# Patient Record
Sex: Female | Born: 1985 | Hispanic: No | Marital: Married | Smoking: Never smoker
Health system: Southern US, Community
[De-identification: ages and names within clinical notes are randomized; demographics above are authoritative.]

## PROBLEM LIST (undated history)

## (undated) DIAGNOSIS — Z789 Other specified health status: Secondary | ICD-10-CM

## (undated) DIAGNOSIS — D62 Acute posthemorrhagic anemia: Secondary | ICD-10-CM

## (undated) HISTORY — PX: NO PAST SURGERIES: SHX2092

---

## 2012-08-15 LAB — OB RESULTS CONSOLE ANTIBODY SCREEN
ANTIBODY SCREEN: NEGATIVE
ANTIBODY SCREEN: NEGATIVE

## 2012-08-15 LAB — OB RESULTS CONSOLE RPR
RPR: NONREACTIVE
RPR: NONREACTIVE

## 2012-08-15 LAB — OB RESULTS CONSOLE ABO/RH
RH TYPE: POSITIVE
RH Type: POSITIVE

## 2012-08-15 LAB — OB RESULTS CONSOLE RUBELLA ANTIBODY, IGM
Rubella: IMMUNE
Rubella: IMMUNE

## 2012-08-15 LAB — OB RESULTS CONSOLE GC/CHLAMYDIA
Chlamydia: NEGATIVE
GC PROBE AMP, GENITAL: NEGATIVE

## 2012-08-15 LAB — OB RESULTS CONSOLE HEPATITIS B SURFACE ANTIGEN
HEP B S AG: NEGATIVE
Hepatitis B Surface Ag: NEGATIVE

## 2012-08-15 LAB — OB RESULTS CONSOLE HIV ANTIBODY (ROUTINE TESTING)
HIV: NONREACTIVE
HIV: NONREACTIVE

## 2012-08-23 ENCOUNTER — Inpatient Hospital Stay (HOSPITAL_COMMUNITY): Admission: AD | Admit: 2012-08-23 | Payer: Self-pay | Source: Ambulatory Visit | Admitting: Obstetrics & Gynecology

## 2012-08-23 LAB — OB RESULTS CONSOLE GC/CHLAMYDIA
Chlamydia: NEGATIVE
Gonorrhea: NEGATIVE

## 2013-02-12 LAB — OB RESULTS CONSOLE GBS
GBS: NEGATIVE
GBS: NEGATIVE

## 2013-02-21 NOTE — L&D Delivery Note (Signed)
Operative Delivery Note At 2:08 PM a viable and healthy female was delivered via Vaginal, Vacuum Investment banker, operational(Extractor).  Presentation: vertex; Position: Occiput,, Anterior; Station: +2 w/ caput.  Verbal consent: obtained from patient.  Risks and benefits discussed in detail.  Risks include, but are not limited to the risks of anesthesia, bleeding, infection, damage to maternal tissues, fetal cephalhematoma.  There is also the risk of inability to effect vaginal delivery of the head, or shoulder dystocia that cannot be resolved by established maneuvers, leading to the need for emergency cesarean section. Tracing reviewed: late decels  And variables noted. Advised assistance with delivery. APGAR: 7, 9; weight  pending.   Placenta status: Intact, Pathology for maternal fever to 100.2 ( ax) Spontaneous.   Cord: 3 vessels with the following complications: None.  Cord pH: none  Anesthesia: Epidural  Instruments: mushroom vacuum Episiotomy: None Lacerations:  Left Vaginal;Labial  minora Suture Repair: 3.0 chromic Est. Blood Loss (mL): 300  Mom to postpartum.  Baby to Couplet care / Skin to Skin.  Teegan Brandis A 03/17/2013, 2:41 PM

## 2013-03-13 ENCOUNTER — Telehealth (HOSPITAL_COMMUNITY): Payer: Self-pay | Admitting: *Deleted

## 2013-03-13 ENCOUNTER — Encounter (HOSPITAL_COMMUNITY): Payer: Self-pay | Admitting: *Deleted

## 2013-03-13 NOTE — Telephone Encounter (Signed)
Preadmission screen  

## 2013-03-16 ENCOUNTER — Encounter (HOSPITAL_COMMUNITY): Payer: Self-pay | Admitting: *Deleted

## 2013-03-16 ENCOUNTER — Inpatient Hospital Stay (HOSPITAL_COMMUNITY)
Admission: AD | Admit: 2013-03-16 | Discharge: 2013-03-19 | DRG: 774 | Disposition: A | Discharge status: Home / Self Care | Payer: BC Managed Care – PPO | Source: Ambulatory Visit | Attending: Obstetrics and Gynecology | Admitting: Obstetrics and Gynecology

## 2013-03-16 HISTORY — DX: Other specified health status: Z78.9

## 2013-03-16 NOTE — Progress Notes (Signed)
No fld noted on perineum. Will have provider do spec exam to r/o rupture. Pt agrees

## 2013-03-16 NOTE — MAU Note (Signed)
My water broke about 2200. No contractions. Fld was clear with alittle blood in it.

## 2013-03-17 ENCOUNTER — Inpatient Hospital Stay (HOSPITAL_COMMUNITY): Payer: BC Managed Care – PPO | Admitting: Anesthesiology

## 2013-03-17 ENCOUNTER — Encounter (HOSPITAL_COMMUNITY): Payer: Self-pay

## 2013-03-17 ENCOUNTER — Encounter (HOSPITAL_COMMUNITY): Payer: BC Managed Care – PPO | Admitting: Anesthesiology

## 2013-03-17 LAB — RPR: RPR Ser Ql: NONREACTIVE

## 2013-03-17 LAB — AMNISURE RUPTURE OF MEMBRANE (ROM) NOT AT ARMC: Amnisure ROM: POSITIVE

## 2013-03-17 LAB — CBC
HEMATOCRIT: 33.6 % — AB (ref 36.0–46.0)
Hemoglobin: 11.8 g/dL — ABNORMAL LOW (ref 12.0–15.0)
MCH: 31.7 pg (ref 26.0–34.0)
MCHC: 35.1 g/dL (ref 30.0–36.0)
MCV: 90.3 fL (ref 78.0–100.0)
Platelets: 210 10*3/uL (ref 150–400)
RBC: 3.72 MIL/uL — AB (ref 3.87–5.11)
RDW: 12.7 % (ref 11.5–15.5)
WBC: 13.8 10*3/uL — AB (ref 4.0–10.5)

## 2013-03-17 LAB — POCT FERN TEST: POCT Fern Test: NEGATIVE

## 2013-03-17 MED ORDER — IBUPROFEN 600 MG PO TABS
600.0000 mg | ORAL_TABLET | Freq: Four times a day (QID) | ORAL | Status: DC
  Administered 2013-03-17 – 2013-03-19 (×7): 600 mg via ORAL
  Filled 2013-03-17 (×7): qty 1

## 2013-03-17 MED ORDER — PHENYLEPHRINE 40 MCG/ML (10ML) SYRINGE FOR IV PUSH (FOR BLOOD PRESSURE SUPPORT)
80.0000 ug | PREFILLED_SYRINGE | INTRAVENOUS | Status: DC | PRN
  Administered 2013-03-17: 80 ug via INTRAVENOUS
  Filled 2013-03-17: qty 2

## 2013-03-17 MED ORDER — ONDANSETRON HCL 4 MG/2ML IJ SOLN: 4.0000 mg | INTRAMUSCULAR | Status: DC | PRN

## 2013-03-17 MED ORDER — FLEET ENEMA 7-19 GM/118ML RE ENEM: 1.0000 | ENEMA | RECTAL | Status: DC | PRN

## 2013-03-17 MED ORDER — ONDANSETRON HCL 4 MG/2ML IJ SOLN: 4.0000 mg | Freq: Four times a day (QID) | INTRAMUSCULAR | Status: DC | PRN

## 2013-03-17 MED ORDER — SODIUM CHLORIDE 0.9 % IV SOLN
INTRAVENOUS | Status: DC
  Administered 2013-03-17: 10:00:00 via EPIDURAL
  Filled 2013-03-17 (×4): qty 12.5

## 2013-03-17 MED ORDER — DIBUCAINE 1 % RE OINT: 1.0000 "application " | TOPICAL_OINTMENT | RECTAL | Status: DC | PRN

## 2013-03-17 MED ORDER — BENZOCAINE-MENTHOL 20-0.5 % EX AERO
1.0000 "application " | INHALATION_SPRAY | CUTANEOUS | Status: DC | PRN
  Administered 2013-03-17: 1 via TOPICAL
  Filled 2013-03-17: qty 56

## 2013-03-17 MED ORDER — BUPIVACAINE HCL (PF) 0.25 % IJ SOLN
INTRAMUSCULAR | Status: DC | PRN
  Administered 2013-03-17: 10 mL via EPIDURAL

## 2013-03-17 MED ORDER — FERROUS SULFATE 325 (65 FE) MG PO TABS
325.0000 mg | ORAL_TABLET | Freq: Two times a day (BID) | ORAL | Status: DC
  Administered 2013-03-18 – 2013-03-19 (×3): 325 mg via ORAL
  Filled 2013-03-17 (×3): qty 1

## 2013-03-17 MED ORDER — SENNOSIDES-DOCUSATE SODIUM 8.6-50 MG PO TABS
2.0000 | ORAL_TABLET | ORAL | Status: DC
Start: 2013-03-18 — End: 2013-03-19
  Administered 2013-03-18 (×2): 2 via ORAL
  Filled 2013-03-17 (×2): qty 2

## 2013-03-17 MED ORDER — LIDOCAINE HCL (PF) 1 % IJ SOLN
30.0000 mL | INTRAMUSCULAR | Status: DC | PRN
  Administered 2013-03-17: 30 mL via SUBCUTANEOUS
  Filled 2013-03-17 (×2): qty 30

## 2013-03-17 MED ORDER — LACTATED RINGERS IV SOLN
500.0000 mL | INTRAVENOUS | Status: DC | PRN
Start: 2013-03-17 — End: 2013-03-17

## 2013-03-17 MED ORDER — WITCH HAZEL-GLYCERIN EX PADS: 1.0000 "application " | MEDICATED_PAD | CUTANEOUS | Status: DC | PRN

## 2013-03-17 MED ORDER — FENTANYL 2.5 MCG/ML BUPIVACAINE 1/10 % EPIDURAL INFUSION (WH - ANES)
14.0000 mL/h | INTRAMUSCULAR | Status: DC | PRN
  Administered 2013-03-17: 14 mL/h via EPIDURAL
  Filled 2013-03-17: qty 125

## 2013-03-17 MED ORDER — OXYCODONE-ACETAMINOPHEN 5-325 MG PO TABS: 1.0000 | ORAL_TABLET | ORAL | Status: DC | PRN

## 2013-03-17 MED ORDER — BUTORPHANOL TARTRATE 1 MG/ML IJ SOLN
1.0000 mg | INTRAMUSCULAR | Status: DC | PRN
  Administered 2013-03-17: 1 mg via INTRAVENOUS
  Filled 2013-03-17: qty 1

## 2013-03-17 MED ORDER — OXYTOCIN 40 UNITS IN LACTATED RINGERS INFUSION - SIMPLE MED: 62.5000 mL/h | INTRAVENOUS | Status: DC

## 2013-03-17 MED ORDER — DIPHENHYDRAMINE HCL 25 MG PO CAPS: 25.0000 mg | ORAL_CAPSULE | Freq: Four times a day (QID) | ORAL | Status: DC | PRN

## 2013-03-17 MED ORDER — ACETAMINOPHEN 325 MG PO TABS: 650.0000 mg | ORAL_TABLET | ORAL | Status: DC | PRN

## 2013-03-17 MED ORDER — EPHEDRINE 5 MG/ML INJ
10.0000 mg | INTRAVENOUS | Status: DC | PRN
  Administered 2013-03-17: 10 mg via INTRAVENOUS
  Filled 2013-03-17: qty 2

## 2013-03-17 MED ORDER — ONDANSETRON HCL 4 MG PO TABS: 4.0000 mg | ORAL_TABLET | ORAL | Status: DC | PRN

## 2013-03-17 MED ORDER — TERBUTALINE SULFATE 1 MG/ML IJ SOLN: 0.2500 mg | Freq: Once | INTRAMUSCULAR | Status: DC | PRN

## 2013-03-17 MED ORDER — PHENYLEPHRINE 40 MCG/ML (10ML) SYRINGE FOR IV PUSH (FOR BLOOD PRESSURE SUPPORT)
80.0000 ug | PREFILLED_SYRINGE | INTRAVENOUS | Status: DC | PRN
  Filled 2013-03-17: qty 2
  Filled 2013-03-17: qty 10

## 2013-03-17 MED ORDER — EPHEDRINE 5 MG/ML INJ
10.0000 mg | INTRAVENOUS | Status: DC | PRN
  Filled 2013-03-17: qty 4
  Filled 2013-03-17: qty 2

## 2013-03-17 MED ORDER — PRENATAL MULTIVITAMIN CH
1.0000 | ORAL_TABLET | Freq: Every day | ORAL | Status: DC
  Administered 2013-03-18: 1 via ORAL
  Filled 2013-03-17: qty 1

## 2013-03-17 MED ORDER — CITRIC ACID-SODIUM CITRATE 334-500 MG/5ML PO SOLN: 30.0000 mL | ORAL | Status: DC | PRN

## 2013-03-17 MED ORDER — LANOLIN HYDROUS EX OINT: TOPICAL_OINTMENT | CUTANEOUS | Status: DC | PRN

## 2013-03-17 MED ORDER — OXYTOCIN BOLUS FROM INFUSION: 500.0000 mL | INTRAVENOUS | Status: DC

## 2013-03-17 MED ORDER — DIPHENHYDRAMINE HCL 50 MG/ML IJ SOLN: 12.5000 mg | INTRAMUSCULAR | Status: DC | PRN

## 2013-03-17 MED ORDER — LACTATED RINGERS IV SOLN
INTRAVENOUS | Status: DC
  Administered 2013-03-17 (×3): via INTRAVENOUS

## 2013-03-17 MED ORDER — ZOLPIDEM TARTRATE 5 MG PO TABS: 5.0000 mg | ORAL_TABLET | Freq: Every evening | ORAL | Status: DC | PRN

## 2013-03-17 MED ORDER — SIMETHICONE 80 MG PO CHEW: 80.0000 mg | CHEWABLE_TABLET | ORAL | Status: DC | PRN

## 2013-03-17 MED ORDER — OXYTOCIN 40 UNITS IN LACTATED RINGERS INFUSION - SIMPLE MED
1.0000 m[IU]/min | INTRAVENOUS | Status: DC
  Administered 2013-03-17: 2 m[IU]/min via INTRAVENOUS
  Filled 2013-03-17: qty 1000

## 2013-03-17 MED ORDER — LACTATED RINGERS IV SOLN: 500.0000 mL | Freq: Once | INTRAVENOUS | Status: DC

## 2013-03-17 MED ORDER — IBUPROFEN 600 MG PO TABS: 600.0000 mg | ORAL_TABLET | Freq: Four times a day (QID) | ORAL | Status: DC | PRN

## 2013-03-17 MED ORDER — LIDOCAINE HCL (PF) 1 % IJ SOLN
INTRAMUSCULAR | Status: DC | PRN
  Administered 2013-03-17 (×2): 5 mL

## 2013-03-17 NOTE — Progress Notes (Signed)
Dierdre SearlesKristie Vent is a 28 y.o. G1P0 at 5741w6d by ultrasound admitted for rupture of membranes  Subjective: Chief Complaint  Patient presents with  . Rupture of Membranes   Breathing with ctx c/o rectal pressure. Unable to void. Bladder distended  Objective: BP 96/39  Pulse 50  Temp(Src) 97.8 F (36.6 C) (Oral)  Resp 18  Ht 5\' 3"  (1.6 m)  Wt 75.751 kg (167 lb)  BMI 29.59 kg/m2      FHT:  FHR: 145 bpm, variability: moderate,  accelerations:  Present,  decelerations:  Absent UC:   regular, every 2 minutes SVE:   3-4 cm dilated, 80% effaced slightly puffy, -1 station increased bloody show Tracing: cat 1  Labs: Lab Results  Component Value Date   WBC 13.8* 03/17/2013   HGB 11.8* 03/17/2013   HCT 33.6* 03/17/2013   MCV 90.3 03/17/2013   PLT 210 03/17/2013    Assessment / Plan: Spontaneous labor, progressing normally P) foley catheter. epidural  Anticipated MOD:  NSVD  Latashia Koch A 03/17/2013, 7:24 AM

## 2013-03-17 NOTE — Progress Notes (Signed)
0004 Up to BR

## 2013-03-17 NOTE — Lactation Note (Signed)
This note was copied from the chart of Dawn Dierdre SearlesKristie Golden. Lactation Consultation Note Initial visit at 6 hours of age.  Mom is feeding baby now in cradle position, baby with wide flanged lips and rhythmic suckling for several minutes.  Encouraged deeper latch with cross cradle hold and discussed what a deep latch looks like.  Mom denies pain and reports seeing colostrum with hand expression.  Mom wants to know how she knows if baby is getting enough.  Discussed feeding cues, cluster feeding and skin to skin.  Referred to baby and me booklet.  [facility] resources given and discussed.  Mom to call for assist as needed.    Patient Name: Dawn Parker Today's Date: 03/17/2013 Reason for consult: Initial assessment   Maternal Data    Feeding Feeding Type: Breast Fed Length of feed: 10 min  LATCH Score/Interventions Latch: Grasps breast easily, tongue down, lips flanged, rhythmical sucking. Intervention(s): Teach feeding cues  Audible Swallowing: A few with stimulation  Type of Nipple: Everted at rest and after stimulation  Comfort (Breast/Nipple): Soft / non-tender     Hold (Positioning): Assistance needed to correctly position infant at breast and maintain latch. Intervention(s): Position options;Support Pillows;Breastfeeding basics reviewed  LATCH Score: 8  Lactation Tools Discussed/Used     Consult Status Consult Status: Follow-up Date: 03/18/13 Follow-up type: In-patient    Beverely RisenShoptaw, Arvella MerlesJana Lynn 03/17/2013, 8:56 PM

## 2013-03-17 NOTE — Plan of Care (Signed)
Problem: Consults Goal: Birthing Suites Patient Information Press F2 to bring up selections list  Outcome: Completed/Met Date Met:  03/17/13  Pt 37-[redacted] weeks EGA

## 2013-03-17 NOTE — Consult Note (Signed)
Neonatology Note:   Attendance at Delivery:    I was asked by Dr. Cherly Hensenousins to attend this vacuum-assisted vaginal delivery at term. The mother is a G1P0 A pos, GBS neg with an uncomplicated pregnancy. ROM 14 hours prior to delivery, fluid clear. Mother had temperature of 100.2 degrees just before delivery. Infant with decreased tone and mottled color at birth, but with normal HR. Needed bulb suctioning for removal of clear secretions, then she cried well. Tone and perfusion improved so that, at 5 minutes, her tone was normal and her capillary refill was 1 second. Ap 7/9. Lungs clear to ausc in DR. 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 I showed this to her parents and counseled them to see a Dermatologist and consider removal of the nevus sometime in the first few years of life as this type of nevus, when present at birth, has about a 10% chance of malignant transformation over her lifetime.To CN to care of Pediatrician.   Doretha Souhristie C. Glennis Montenegro, MD

## 2013-03-17 NOTE — Anesthesia Procedure Notes (Signed)
Epidural Patient location during procedure: OB Start time: 03/17/2013 7:30 AM  Staffing Anesthesiologist: Brayton CavesJACKSON, Mushka Laconte Performed by: anesthesiologist   Preanesthetic Checklist Completed: patient identified, site marked, surgical consent, pre-op evaluation, timeout performed, IV checked, risks and benefits discussed and monitors and equipment checked  Epidural Patient position: sitting Prep: site prepped and draped and DuraPrep Patient monitoring: continuous pulse ox and blood pressure Approach: midline Injection technique: LOR air  Needle:  Needle type: Tuohy  Needle gauge: 17 G Needle length: 9 cm and 9 Needle insertion depth: 5 cm cm Catheter type: closed end flexible Catheter size: 19 Gauge Catheter at skin depth: 10 cm Test dose: negative  Assessment Events: blood not aspirated, injection not painful, no injection resistance, negative IV test and no paresthesia  Additional Notes Patient identified.  Risk benefits discussed including failed block, incomplete pain control, headache, nerve damage, paralysis, blood pressure changes, nausea, vomiting, reactions to medication both toxic or allergic, and postpartum back pain.  Patient expressed understanding and wished to proceed.  All questions were answered.  Sterile technique used throughout procedure and epidural site dressed with sterile barrier dressing. No paresthesia or other complications noted.The patient did not experience any signs of intravascular injection such as tinnitus or metallic taste in mouth nor signs of intrathecal spread such as rapid motor block. Please see nursing notes for vital signs.

## 2013-03-17 NOTE — Progress Notes (Signed)
S: comfortable Pitocin 8 MIU O: VE 7-8 cm/100/-2 asynclytic Tracing cat 1. Ctx q  IMP: active phase P) cont pitocin  Exaggerated left sims position

## 2013-03-17 NOTE — H&P (Signed)
Dawn SearlesKristie Parker is a 28 y.o. female presenting for early labor (+) SROM @ 10:30 pm confirmed by (+) amniosure  Maternal Medical History:  Reason for admission: Rupture of membranes and contractions.   Contractions: Onset was 6-12 hours ago.   Frequency: irregular.   Perceived severity is moderate.    Fetal activity: Perceived fetal activity is normal.    Prenatal complications: no prenatal complications Prenatal Complications - Diabetes: none.    OB History   Grav Para Term Preterm Abortions TAB SAB Ect Mult Living   1              Past Medical History  Diagnosis Date  . Medical history non-contributory    Past Surgical History  Procedure Laterality Date  . No past surgeries     Family History: family history includes Cancer in her paternal grandmother. Social History:  reports that she has never smoked. She does not have any smokeless tobacco history on file. She reports that she does not drink alcohol or use illicit drugs.   Prenatal Transfer Tool  Maternal Diabetes: No Genetic Screening: Normal Maternal Ultrasounds/Referrals: Normal Fetal Ultrasounds or other Referrals:  None Maternal Substance Abuse:  No Significant Maternal Medications:  None Significant Maternal Lab Results:  Lab values include: Group B Strep negative Other Comments:  None  Review of Systems  All other systems reviewed and are negative.    Dilation: 2 Effacement (%): 80 Station: -2 Exam by:: Dawn Clarkston MD Blood pressure 96/39, pulse 50, temperature 97.8 F (36.6 C), temperature source Oral, resp. rate 18, height 5\' 3"  (1.6 m), weight 75.751 kg (167 lb). Exam Physical Exam  Constitutional: She is oriented to person, place, and time. She appears well-developed and well-nourished.  HENT:  Head: Normocephalic.  Neck: Neck supple.  Cardiovascular: Normal rate.   Respiratory: Breath sounds normal.  GI: Soft.  Musculoskeletal: She exhibits no edema.  Neurological: She is alert and oriented to  person, place, and time.  Skin: Skin is warm and dry.  Psychiatric: She has a normal mood and affect.    Prenatal labs: ABO, Rh: A/Positive/-- (06/25 0000) Antibody: Negative (06/25 0000) Rubella: Immune (06/25 0000) RPR: Nonreactive (06/25 0000)  HBsAg: Negative (06/25 0000)  HIV: Non-reactive (06/25 0000)  GBS: Negative (12/23 0000)   Assessment/Plan:  Term gestation SROM P) admit routine labs. Pitocin augmentation  Epidural prn Dawn Parker A 03/17/2013, 7:03 AM

## 2013-03-17 NOTE — Progress Notes (Signed)
Pt had special epidural med mixture from pharmacy which could not be wasted in pixis. 64.5 ml wasted in sink after delivery and this was witnessed by Hazel SamsMary Early, RN

## 2013-03-17 NOTE — Anesthesia Preprocedure Evaluation (Signed)

## 2013-03-17 NOTE — Progress Notes (Signed)
Report called to Caroline RN in BS. Pt to BS via w/c.  

## 2013-03-17 NOTE — Progress Notes (Signed)
Dr Cherly Hensenousins notified of pos amnisure. Will admit to Bs

## 2013-03-17 NOTE — Progress Notes (Signed)
Dr Cherly Hensenousins notified of pt's admission and status. Aware of ? SROM with neg fern x 2. Will proceed with amnisure.

## 2013-03-18 ENCOUNTER — Encounter (HOSPITAL_COMMUNITY): Payer: Self-pay | Admitting: Obstetrics and Gynecology

## 2013-03-18 LAB — CBC
HEMATOCRIT: 31.1 % — AB (ref 36.0–46.0)
HEMOGLOBIN: 10.7 g/dL — AB (ref 12.0–15.0)
MCH: 31.5 pg (ref 26.0–34.0)
MCHC: 34.4 g/dL (ref 30.0–36.0)
MCV: 91.5 fL (ref 78.0–100.0)
Platelets: 172 10*3/uL (ref 150–400)
RBC: 3.4 MIL/uL — ABNORMAL LOW (ref 3.87–5.11)
RDW: 12.9 % (ref 11.5–15.5)
WBC: 16.6 10*3/uL — ABNORMAL HIGH (ref 4.0–10.5)

## 2013-03-18 NOTE — Anesthesia Postprocedure Evaluation (Signed)
Anesthesia Post Note  Patient: Dierdre SearlesKristie Fratto  Procedure(s) Performed: * No procedures listed *  Anesthesia type: Epidural  Patient location: Mother/Baby  Post pain: Pain level controlled  Post assessment: Post-op Vital signs reviewed  Last Vitals:  Filed Vitals:   03/18/13 0521  BP: 105/67  Pulse: 61  Temp: 36.7 C  Resp: 18    Post vital signs: Reviewed  Level of consciousness: awake  Complications: No apparent anesthesia complications

## 2013-03-18 NOTE — Lactation Note (Signed)
This note was copied from the chart of Dawn Dierdre SearlesKristie Roh. Lactation Consultation Note Follow up consultation; mom states baby is breastfeeding very well; c/o slight sore nipples. Nipples with positional stripe bruise. Comfort gels provided, discussed position and latch. Baby began to show feeding cues, offered to assist with feeding, mom accepts.  Baby latched well to right side with deep latch, rhythmic sucking and occasional audible swallowing; mom states comfortable.  Enc mom to call for help if needed.   Patient Name: Dawn Parker: 03/18/2013 Reason for consult: Follow-up assessment   Maternal Data    Feeding Feeding Type: Breast Fed  LATCH Score/Interventions Latch: Grasps breast easily, tongue down, lips flanged, rhythmical sucking.  Audible Swallowing: Spontaneous and intermittent  Type of Nipple: Everted at rest and after stimulation  Comfort (Breast/Nipple): Filling, red/small blisters or bruises, mild/mod discomfort  Problem noted: Mild/Moderate discomfort Interventions (Mild/moderate discomfort): Comfort gels  Hold (Positioning): Assistance needed to correctly position infant at breast and maintain latch.  LATCH Score: 8  Lactation Tools Discussed/Used     Consult Status Consult Status: PRN    Lenard ForthSanders, Arty Lantzy Fulmer 03/18/2013, 3:30 PM

## 2013-03-18 NOTE — Progress Notes (Signed)
Ur chart review completed.  

## 2013-03-18 NOTE — Progress Notes (Signed)
Patient ID: Dierdre SearlesKristie Bisesi, female   DOB: [birth year], 28 y.o.   MRN: [id number] PPD # 1 SVD  S:  Reports feeling well             Tolerating po/ No nausea or vomiting             Bleeding is light             Pain controlled with ibuprofen (OTC)             Up ad lib / ambulatory / voiding without difficulties    Newborn  Information for the patient's newborn:  [name], Girl Sanyla [[id number]][[id number]]  female  breast feeding   O:  A & O x 3, in no apparent distress              VS:  Filed Vitals:   03/17/13 1705 03/17/13 1831 03/17/13 2205 03/18/13 0521  BP: 107/65 127/73 106/64 105/67  Pulse: 73 73 59 61  Temp: 99 F (37.2 C) 99.6 F (37.6 C) 98.9 F (37.2 C) 98.1 F (36.7 C)  TempSrc: Oral Oral Oral Oral  Resp: 18 18 18 18   Height:      Weight:      SpO2:        LABS:  Recent Labs  03/17/13 0211 03/18/13 0600  WBC 13.8* 16.6*  HGB 11.8* 10.7*  HCT 33.6* 31.1*  PLT 210 172    Blood type: A/Positive/-- (06/25 0000)  Rubella: Immune (06/25 0000)   I&O: I/O last 3 completed shifts: In: -  Out: 1600 [Urine:1300; Blood:300]             Lungs: Clear and unlabored  Heart: regular rate and rhythm / no murmurs  Abdomen: soft, non-tender, non-distended              Fundus: firm, non-tender, U-1  Perineum: labial repair healing well - no edema  Lochia: minimal  Extremities: trace edema, no calf pain or tenderness, no Homans    A/P: PPD # 1  27 y.o., G1P1001   Principal Problem:   Postpartum care following vaginal delivery (1/25)   Doing well - stable status  Routine post partum orders  Anticipate discharge tomorrow    Raelyn MoraAWSON, Pattricia Weiher, M, MSN, CNM 03/18/2013, 9:00 AM

## 2013-03-19 ENCOUNTER — Encounter (HOSPITAL_COMMUNITY): Payer: Self-pay | Admitting: *Deleted

## 2013-03-19 MED ORDER — IBUPROFEN 600 MG PO TABS: 600.0000 mg | ORAL_TABLET | Freq: Four times a day (QID) | ORAL | Status: DC | PRN

## 2013-03-19 MED ORDER — SENNOSIDES-DOCUSATE SODIUM 8.6-50 MG PO TABS: 2.0000 | ORAL_TABLET | Freq: Every evening | ORAL | Status: DC | PRN

## 2013-03-19 MED ORDER — FERROUS SULFATE 325 (65 FE) MG PO TABS: 325.0000 mg | ORAL_TABLET | Freq: Every day | ORAL | Status: DC

## 2013-03-19 MED ORDER — OXYCODONE-ACETAMINOPHEN 5-325 MG PO TABS: 1.0000 | ORAL_TABLET | Freq: Four times a day (QID) | ORAL | Status: DC | PRN

## 2013-03-19 NOTE — Discharge Summary (Signed)
Obstetric Discharge Summary Reason for Admission: G1 P0 with SROM and onset active labor @ 39wks Prenatal Procedures: NST and ultrasound Intrapartum Procedures: VAVD Postpartum Procedures: none Complications-Operative and Postpartum: minor left vaginal/labial tear with repair Hemoglobin  Date Value Range Status  03/18/2013 10.7* 12.0 - 15.0 g/dL Final     HCT  Date Value Range Status  03/18/2013 31.1* 36.0 - 46.0 % Final    Physical Exam:  General: alert, cooperative and no distress Lochia: appropriate Uterine Fundus: firm Incision: n/a DVT Evaluation: No evidence of DVT seen on physical exam. Negative Homan's sign.  Discharge Diagnoses: G1 P1 s/p VAVD @ 39wks, w/minor left vag/labial tear  Discharge Information: Date: 03/19/2013 Activity: pelvic rest Diet: routine Medications: PNV, Ibuprofen, Colace, Iron and Percocet Condition: stable Instructions: refer to practice specific booklet Discharge to: home Follow-up Information   Follow up with LAVOIE,MARIE-LYNE, MD In 6 weeks.   Specialty:  Obstetrics and Gynecology   Contact information:   [address]elda Severe[address] [place] [id number] [phone]       Newborn Data: Live born female on 03/17/13 Birth Weight: 8 lb 0.8 oz (3651 g) APGAR: 7, 9  Home with mother.  FISHER,JULIE K 03/19/2013, 10:01 AM

## 2013-03-19 NOTE — Progress Notes (Signed)
Patient ID: Dawn SearlesKristie Parker, female   DOB: 1985/10/27, 28 y.o.   MRN: [id number] PPD # 2  Subjective: Pt reports feeling well and eager for d/c home/ Pain controlled with ibuprofen and rare percocet Tolerating po/ Voiding without problems/ No n/v Bleeding is light/ Newborn info:  Information for the patient's newborn:  Dawn Parker, Dawn Parker [[id number]][[id number]]  female Feeding: breast    Objective:  VS: Blood pressure 99/60, pulse 62, temperature 97.9 F (36.6 C), temperature source Oral, resp. rate 18.    Recent Labs  03/17/13 0211 03/18/13 0600  WBC 13.8* 16.6*  HGB 11.8* 10.7*  HCT 33.6* 31.1*  PLT 210 172    Blood type: A/Positive Rubella: Immune    Physical Exam:  General:  alert, cooperative and no distress CV: Regular rate and rhythm Resp: clear Abdomen: soft, nontender, normal bowel sounds Uterine Fundus: firm, below umbilicus, nontender Perineum: healing with good reapproximation Lochia: minimal Ext: Homans sign is negative, no sign of DVT and no edema, redness or tenderness in the calves or thighs    A/P: PPD # 2/ G1P1001/ S/P: VAVD w/ left vaginal/labial tear w/repair Doing well and stable for discharge home RX: Ibuprofen 600mg  po Q 6 hrs prn pain #30 Refill x 1 Niferex 150mg  po QD/BID #30/#60 Refill x 1 Percocet 5/325 1 to 2 po Q 4 hrs prn pain #10 No refill Colace 100mg  po up to TID prn #30 Ref x 1 WOB/GYN booklet given Routine pp visit in 6wks   Demetrius RevelFISHER,Litsy Epting K, MSN, [facility] 03/19/2013, 9:10 AM

## 2013-03-19 NOTE — Lactation Note (Signed)
This note was copied from the chart of [name]. Lactation Consultation Note Follow up consult:  Baby 1943 hours old and being discharged.  Baby has breastfed 9 times in 24 hours, 4 voids, 5 stools.  Mother states breastfeeding going well (LS9), no complaints, some soreness.  Recommend if soreness gets worse to call lacatation department.  Mother currently using comfort gels.  Reviewed engorgement care and cluster feeding.  Encouraged mother to call if further assistance with latching if needed before discharge.   Patient Name: [name] Dawn Parker Date: 03/19/2013 Reason for consult: Follow-up assessment   Maternal Data    Feeding    LATCH Score/Interventions                      Lactation Tools Discussed/Used     Consult Status Consult Status: Complete    Hardie PulleyBerkelhammer, Tyger Wichman Boschen 03/19/2013, 9:58 AM

## 2013-03-20 ENCOUNTER — Inpatient Hospital Stay (HOSPITAL_COMMUNITY): Admission: RE | Admit: 2013-03-20 | Payer: Self-pay | Source: Ambulatory Visit

## 2013-10-05 ENCOUNTER — Ambulatory Visit (INDEPENDENT_AMBULATORY_CARE_PROVIDER_SITE_OTHER): Payer: BLUE CROSS/BLUE SHIELD

## 2013-10-05 ENCOUNTER — Ambulatory Visit (INDEPENDENT_AMBULATORY_CARE_PROVIDER_SITE_OTHER): Payer: BLUE CROSS/BLUE SHIELD | Admitting: Family Medicine

## 2013-10-05 VITALS — BP 112/72 | HR 76 | Temp 97.9°F | Resp 16 | Ht 63.0 in | Wt 131.8 lb

## 2013-10-05 DIAGNOSIS — M79609 Pain in unspecified limb: Secondary | ICD-10-CM

## 2013-10-05 DIAGNOSIS — S92402A Displaced unspecified fracture of left great toe, initial encounter for closed fracture: Secondary | ICD-10-CM

## 2013-10-05 DIAGNOSIS — M79675 Pain in left toe(s): Secondary | ICD-10-CM

## 2013-10-05 DIAGNOSIS — S92919A Unspecified fracture of unspecified toe(s), initial encounter for closed fracture: Secondary | ICD-10-CM

## 2013-10-05 NOTE — Patient Instructions (Signed)
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
## 2014-02-21 NOTE — L&D Delivery Note (Signed)
Operative Delivery Note At 1:17 AM a viable and healthy female was delivered via Vaginal, Vacuum Investment banker, operational).  Presentation: vertex; Position: Right,, Occiput,, Anterior; Station: +2.  Verbal consent: obtained from patient.  Risks and benefits discussed in detail.  Risks include, but are not limited to the risks of anesthesia, bleeding, infection, damage to maternal tissues, fetal cephalhematoma.  There is also the risk of inability to effect vaginal delivery of the head, or shoulder dystocia that cannot be resolved by established maneuvers, leading to the need for emergency cesarean section. For terminal bradycardia APGAR: 9, 10; weight pending .   Placenta status: Intact, Spontaneous.  Marginal cord insertion Cord: 3 vessels with the following complications: None.  Cord pH: none  Anesthesia: Epidural  Instruments: mushroom Episiotomy: None Lacerations: 2nd degree;Perineal Suture Repair: 3.0 chromic Est. Blood Loss (mL): 350  Mom to postpartum.  Baby to Couplet care / Skin to Skin.  Dawn Parker A 10/17/2014, 2:27 AM

## 2014-03-11 LAB — OB RESULTS CONSOLE RUBELLA ANTIBODY, IGM: RUBELLA: IMMUNE

## 2014-03-11 LAB — OB RESULTS CONSOLE RPR: RPR: NONREACTIVE

## 2014-03-11 LAB — OB RESULTS CONSOLE HEPATITIS B SURFACE ANTIGEN: Hepatitis B Surface Ag: NEGATIVE

## 2014-03-11 LAB — OB RESULTS CONSOLE HIV ANTIBODY (ROUTINE TESTING): HIV: NONREACTIVE

## 2014-03-20 LAB — OB RESULTS CONSOLE GC/CHLAMYDIA
Chlamydia: NEGATIVE
Gonorrhea: NEGATIVE

## 2014-09-08 LAB — OB RESULTS CONSOLE GBS: GBS: POSITIVE

## 2014-10-16 ENCOUNTER — Other Ambulatory Visit: Payer: Self-pay | Admitting: Obstetrics & Gynecology

## 2014-10-16 ENCOUNTER — Inpatient Hospital Stay (HOSPITAL_COMMUNITY)
Admission: AD | Admit: 2014-10-16 | Discharge: 2014-10-18 | DRG: 775 | Disposition: A | Discharge status: Home / Self Care | Payer: BLUE CROSS/BLUE SHIELD | Source: Ambulatory Visit | Attending: Obstetrics and Gynecology | Admitting: Obstetrics and Gynecology

## 2014-10-16 ENCOUNTER — Inpatient Hospital Stay (HOSPITAL_COMMUNITY): Payer: BLUE CROSS/BLUE SHIELD | Admitting: Anesthesiology

## 2014-10-16 ENCOUNTER — Encounter (HOSPITAL_COMMUNITY): Payer: Self-pay | Admitting: *Deleted

## 2014-10-16 DIAGNOSIS — O99824 Streptococcus B carrier state complicating childbirth: Secondary | ICD-10-CM | POA: Diagnosis present

## 2014-10-16 DIAGNOSIS — O2243 Hemorrhoids in pregnancy, third trimester: Secondary | ICD-10-CM | POA: Diagnosis present

## 2014-10-16 DIAGNOSIS — D62 Acute posthemorrhagic anemia: Secondary | ICD-10-CM | POA: Diagnosis not present

## 2014-10-16 DIAGNOSIS — O4103X0 Oligohydramnios, third trimester, not applicable or unspecified: Secondary | ICD-10-CM | POA: Diagnosis present

## 2014-10-16 DIAGNOSIS — Z8249 Family history of ischemic heart disease and other diseases of the circulatory system: Secondary | ICD-10-CM

## 2014-10-16 DIAGNOSIS — O4100X0 Oligohydramnios, unspecified trimester, not applicable or unspecified: Secondary | ICD-10-CM | POA: Diagnosis present

## 2014-10-16 DIAGNOSIS — Z8759 Personal history of other complications of pregnancy, childbirth and the puerperium: Secondary | ICD-10-CM

## 2014-10-16 DIAGNOSIS — Z3A40 40 weeks gestation of pregnancy: Secondary | ICD-10-CM | POA: Diagnosis present

## 2014-10-16 HISTORY — DX: Acute posthemorrhagic anemia: D62

## 2014-10-16 LAB — CBC
HEMATOCRIT: 35.1 % — AB (ref 36.0–46.0)
Hemoglobin: 11.9 g/dL — ABNORMAL LOW (ref 12.0–15.0)
MCH: 31.6 pg (ref 26.0–34.0)
MCHC: 33.9 g/dL (ref 30.0–36.0)
MCV: 93.1 fL (ref 78.0–100.0)
PLATELETS: 250 10*3/uL (ref 150–400)
RBC: 3.77 MIL/uL — AB (ref 3.87–5.11)
RDW: 13 % (ref 11.5–15.5)
WBC: 15.1 10*3/uL — AB (ref 4.0–10.5)

## 2014-10-16 LAB — ABO/RH: ABO/RH(D): A POS

## 2014-10-16 LAB — TYPE AND SCREEN
ABO/RH(D): A POS
Antibody Screen: NEGATIVE

## 2014-10-16 MED ORDER — CITRIC ACID-SODIUM CITRATE 334-500 MG/5ML PO SOLN: 30.0000 mL | ORAL | Status: DC | PRN

## 2014-10-16 MED ORDER — LIDOCAINE HCL (PF) 1 % IJ SOLN: 30.0000 mL | INTRAMUSCULAR | Status: DC | PRN

## 2014-10-16 MED ORDER — OXYCODONE-ACETAMINOPHEN 5-325 MG PO TABS
1.0000 | ORAL_TABLET | ORAL | Status: DC | PRN
  Administered 2014-10-17 (×2): 1 via ORAL
  Filled 2014-10-16 (×2): qty 1

## 2014-10-16 MED ORDER — LACTATED RINGERS IV SOLN: 500.0000 mL | INTRAVENOUS | Status: DC | PRN

## 2014-10-16 MED ORDER — ONDANSETRON HCL 4 MG/2ML IJ SOLN: 4.0000 mg | Freq: Four times a day (QID) | INTRAMUSCULAR | Status: DC | PRN

## 2014-10-16 MED ORDER — LACTATED RINGERS IV SOLN
INTRAVENOUS | Status: DC
  Administered 2014-10-16: via INTRAVENOUS

## 2014-10-16 MED ORDER — OXYTOCIN 40 UNITS IN LACTATED RINGERS INFUSION - SIMPLE MED: 62.5000 mL/h | INTRAVENOUS | Status: DC

## 2014-10-16 MED ORDER — PHENYLEPHRINE 40 MCG/ML (10ML) SYRINGE FOR IV PUSH (FOR BLOOD PRESSURE SUPPORT)
80.0000 ug | PREFILLED_SYRINGE | INTRAVENOUS | Status: DC | PRN
  Filled 2014-10-16: qty 20

## 2014-10-16 MED ORDER — OXYCODONE-ACETAMINOPHEN 5-325 MG PO TABS: 2.0000 | ORAL_TABLET | ORAL | Status: DC | PRN

## 2014-10-16 MED ORDER — FENTANYL 2.5 MCG/ML BUPIVACAINE 1/10 % EPIDURAL INFUSION (WH - ANES)
14.0000 mL/h | INTRAMUSCULAR | Status: DC | PRN
  Administered 2014-10-17: 14 mL/h via EPIDURAL
  Filled 2014-10-16: qty 125

## 2014-10-16 MED ORDER — OXYTOCIN 10 UNIT/ML IJ SOLN: 10.0000 [IU] | Freq: Once | INTRAMUSCULAR | Status: DC

## 2014-10-16 MED ORDER — ACETAMINOPHEN 325 MG PO TABS: 650.0000 mg | ORAL_TABLET | ORAL | Status: DC | PRN

## 2014-10-16 MED ORDER — DIPHENHYDRAMINE HCL 50 MG/ML IJ SOLN: 12.5000 mg | INTRAMUSCULAR | Status: DC | PRN

## 2014-10-16 MED ORDER — LACTATED RINGERS IV SOLN: INTRAVENOUS | Status: DC

## 2014-10-16 MED ORDER — PENICILLIN G POTASSIUM 5000000 UNITS IJ SOLR
5.0000 10*6.[IU] | Freq: Once | INTRAVENOUS | Status: AC
  Administered 2014-10-16: 5 10*6.[IU] via INTRAVENOUS
  Filled 2014-10-16: qty 5

## 2014-10-16 MED ORDER — PENICILLIN G POTASSIUM 5000000 UNITS IJ SOLR
2.5000 10*6.[IU] | INTRAVENOUS | Status: DC
  Administered 2014-10-16: 2.5 10*6.[IU] via INTRAVENOUS
  Filled 2014-10-16 (×5): qty 2.5

## 2014-10-16 MED ORDER — LIDOCAINE HCL (PF) 1 % IJ SOLN
INTRAMUSCULAR | Status: DC | PRN
  Administered 2014-10-16: 3 mL via EPIDURAL

## 2014-10-16 MED ORDER — OXYCODONE-ACETAMINOPHEN 5-325 MG PO TABS: 1.0000 | ORAL_TABLET | ORAL | Status: DC | PRN

## 2014-10-16 MED ORDER — FENTANYL 2.5 MCG/ML BUPIVACAINE 1/10 % EPIDURAL INFUSION (WH - ANES): 14.0000 mL/h | INTRAMUSCULAR | Status: DC | PRN

## 2014-10-16 MED ORDER — FLEET ENEMA 7-19 GM/118ML RE ENEM: 1.0000 | ENEMA | RECTAL | Status: DC | PRN

## 2014-10-16 MED ORDER — LIDOCAINE HCL (PF) 1 % IJ SOLN
30.0000 mL | INTRAMUSCULAR | Status: DC | PRN
  Filled 2014-10-16: qty 30

## 2014-10-16 MED ORDER — OXYTOCIN BOLUS FROM INFUSION: 500.0000 mL | INTRAVENOUS | Status: DC

## 2014-10-16 MED ORDER — EPHEDRINE 5 MG/ML INJ: 10.0000 mg | INTRAVENOUS | Status: DC | PRN

## 2014-10-16 MED ORDER — OXYTOCIN 40 UNITS IN LACTATED RINGERS INFUSION - SIMPLE MED
1.0000 m[IU]/min | INTRAVENOUS | Status: DC
  Administered 2014-10-16: 2 m[IU]/min via INTRAVENOUS
  Filled 2014-10-16: qty 1000

## 2014-10-16 MED ORDER — SODIUM BICARBONATE 8.4 % IV SOLN
INTRAVENOUS | Status: DC | PRN
  Administered 2014-10-16: 2 mL via EPIDURAL
  Administered 2014-10-16: 5 mL via EPIDURAL
  Administered 2014-10-16: 3 mL via EPIDURAL

## 2014-10-16 MED ORDER — TERBUTALINE SULFATE 1 MG/ML IJ SOLN
0.2500 mg | Freq: Once | INTRAMUSCULAR | Status: DC | PRN
Start: 2014-10-16 — End: 2014-10-17

## 2014-10-16 NOTE — MAU Note (Signed)
Patient sent for direct admit no beds on L&D, oliogo, some contractions.

## 2014-10-16 NOTE — Anesthesia Preprocedure Evaluation (Signed)

## 2014-10-16 NOTE — H&P (Signed)
Dawn Parker is a 29 y.o. female presenting @ 40 1/7 weeks for IOL 2nd to oligohydramnios.  Maternal Medical History:  Reason for admission: Contractions.   Fetal activity: Perceived fetal activity is normal.    Prenatal complications: Oligohydramnios.   Prenatal Complications - Diabetes: none.    OB History    Gravida Para Term Preterm AB TAB SAB Ectopic Multiple Living   Past Medical History  Diagnosis Date  . Medical history non-contributory   . Postpartum care following vaginal delivery (1/25) 03/17/2013   Past Surgical History  Procedure Laterality Date  . No past surgeries     Family History: family history includes Cancer in her paternal grandmother; Heart disease in her maternal grandmother; Hyperlipidemia in her father and mother. Social History:  reports that she has never smoked. She does not have any smokeless tobacco history on file. She reports that she does not drink alcohol or use illicit drugs.   Prenatal Transfer Tool  Maternal Diabetes: No Genetic Screening: Normal Maternal Ultrasounds/Referrals: Normal Fetal Ultrasounds or other Referrals:  None Maternal Substance Abuse:  No Significant Maternal Medications:  None Significant Maternal Lab Results:  Lab values include: Group B Strep positive Other Comments:  None  ROSneg    Blood pressure 119/67, pulse 78, temperature 98.8 F (37.1 C), temperature source Oral, resp. rate 18, height  (1.6 m), weight 77.565 kg (171 lb), currently breastfeeding. Exam Physical Exam  Constitutional: She is oriented to person, place, and time. She appears well-developed and well-nourished.  HENT:  Head: Normocephalic and atraumatic.  Eyes: EOM are normal.  Neck: Neck supple.  Cardiovascular: Normal rate and regular rhythm.   Respiratory: Breath sounds normal.  GI: Soft.  Neurological: She is alert and oriented to person, place, and time.  Skin: Skin is warm and dry.  Psychiatric: She has a  normal mood and affect.   VE 3.5/60/0 per RN Prenatal labs: ABO, Rh:  A positive Antibody:  neg Rubella:  Immune RPR:   NR HBsAg:  neg  HIV:   NR GBS:   positive  Assessment/Plan: Oligohydramnios Term gestation GBS cx (+) P) admit routine labs. IV pitocin. IV PCN epidural prn. Defer amniotomy   Dawn Parker A 10/16/2014, 6:28 PM

## 2014-10-16 NOTE — Anesthesia Procedure Notes (Signed)
Epidural Patient location during procedure: OB  Preanesthetic Checklist Completed: patient identified, site marked, surgical consent, pre-op evaluation, timeout performed, IV checked, risks and benefits discussed and monitors and equipment checked  Epidural Patient position: sitting Prep: site prepped and draped and DuraPrep Patient monitoring: continuous pulse ox and blood pressure Approach: midline Location: L3-L4 Injection technique: LOR air  Needle:  Needle type: Tuohy  Needle gauge: 17 G Needle length: 9 cm and 9 Needle insertion depth: 5 cm cm Catheter type: closed end flexible Catheter size: 19 Gauge Catheter at skin depth: 10 cm Test dose: negative  Assessment Events: blood not aspirated, injection not painful, no injection resistance, negative IV test and no paresthesia

## 2014-10-17 ENCOUNTER — Encounter (HOSPITAL_COMMUNITY): Payer: Self-pay | Admitting: *Deleted

## 2014-10-17 DIAGNOSIS — Z8759 Personal history of other complications of pregnancy, childbirth and the puerperium: Secondary | ICD-10-CM

## 2014-10-17 LAB — RPR: RPR Ser Ql: NONREACTIVE

## 2014-10-17 MED ORDER — LANOLIN HYDROUS EX OINT: TOPICAL_OINTMENT | CUTANEOUS | Status: DC | PRN

## 2014-10-17 MED ORDER — IBUPROFEN 800 MG PO TABS: 800.0000 mg | ORAL_TABLET | Freq: Three times a day (TID) | ORAL | Status: DC

## 2014-10-17 MED ORDER — ACETAMINOPHEN 325 MG PO TABS: 650.0000 mg | ORAL_TABLET | ORAL | Status: DC | PRN

## 2014-10-17 MED ORDER — ONDANSETRON HCL 4 MG PO TABS: 4.0000 mg | ORAL_TABLET | ORAL | Status: DC | PRN

## 2014-10-17 MED ORDER — HYDROCORTISONE 2.5 % RE CREA
TOPICAL_CREAM | Freq: Four times a day (QID) | RECTAL | Status: DC
  Filled 2014-10-17: qty 28.35

## 2014-10-17 MED ORDER — WITCH HAZEL-GLYCERIN EX PADS: 1.0000 "application " | MEDICATED_PAD | CUTANEOUS | Status: DC | PRN

## 2014-10-17 MED ORDER — PRENATAL MULTIVITAMIN CH
1.0000 | ORAL_TABLET | Freq: Every day | ORAL | Status: DC
  Administered 2014-10-17 – 2014-10-18 (×2): 1 via ORAL
  Filled 2014-10-17 (×2): qty 1

## 2014-10-17 MED ORDER — ZOLPIDEM TARTRATE 5 MG PO TABS: 5.0000 mg | ORAL_TABLET | Freq: Every evening | ORAL | Status: DC | PRN

## 2014-10-17 MED ORDER — FERROUS SULFATE 325 (65 FE) MG PO TABS
325.0000 mg | ORAL_TABLET | Freq: Two times a day (BID) | ORAL | Status: DC
  Administered 2014-10-18: 325 mg via ORAL
  Filled 2014-10-17 (×3): qty 1

## 2014-10-17 MED ORDER — IBUPROFEN 600 MG PO TABS
600.0000 mg | ORAL_TABLET | Freq: Four times a day (QID) | ORAL | Status: DC | PRN
  Administered 2014-10-17: 600 mg via ORAL
  Filled 2014-10-17: qty 1

## 2014-10-17 MED ORDER — ONDANSETRON HCL 4 MG/2ML IJ SOLN: 4.0000 mg | INTRAMUSCULAR | Status: DC | PRN

## 2014-10-17 MED ORDER — SIMETHICONE 80 MG PO CHEW: 80.0000 mg | CHEWABLE_TABLET | ORAL | Status: DC | PRN

## 2014-10-17 MED ORDER — SENNOSIDES-DOCUSATE SODIUM 8.6-50 MG PO TABS
2.0000 | ORAL_TABLET | ORAL | Status: DC
Start: 2014-10-18 — End: 2014-10-18
  Administered 2014-10-17: 2 via ORAL
  Filled 2014-10-17: qty 2

## 2014-10-17 MED ORDER — DIPHENHYDRAMINE HCL 25 MG PO CAPS: 25.0000 mg | ORAL_CAPSULE | Freq: Four times a day (QID) | ORAL | Status: DC | PRN

## 2014-10-17 MED ORDER — BENZOCAINE-MENTHOL 20-0.5 % EX AERO
1.0000 "application " | INHALATION_SPRAY | CUTANEOUS | Status: DC | PRN
  Filled 2014-10-17 (×2): qty 56

## 2014-10-17 MED ORDER — IBUPROFEN 600 MG PO TABS
600.0000 mg | ORAL_TABLET | Freq: Four times a day (QID) | ORAL | Status: DC
  Administered 2014-10-17 – 2014-10-18 (×5): 600 mg via ORAL
  Filled 2014-10-17 (×5): qty 1

## 2014-10-17 MED ORDER — OXYCODONE-ACETAMINOPHEN 5-325 MG PO TABS: 1.0000 | ORAL_TABLET | ORAL | Status: DC | PRN

## 2014-10-17 MED ORDER — DIBUCAINE 1 % RE OINT
1.0000 "application " | TOPICAL_OINTMENT | RECTAL | Status: DC | PRN
  Filled 2014-10-17: qty 28

## 2014-10-17 MED ORDER — OXYCODONE-ACETAMINOPHEN 5-325 MG PO TABS: 2.0000 | ORAL_TABLET | ORAL | Status: DC | PRN

## 2014-10-17 NOTE — Progress Notes (Signed)
Pt seen for vulvar swelling thought related to hematoma  VE: site inspected 1) edematous hemorrhoids nonthrombosed Perineal laceration still well approximated.  Lower labia majus ( Left > right ecchymotic with associated labial edema IMP: predominantly labial edema No indication for surgical intervention' Cont ice and elevate bottom

## 2014-10-17 NOTE — Lactation Note (Signed)
This note was copied from the chart of Dawn Parker. Lactation Consultation Note Initial visit at 20 hours of age.  Mom reports baby just finished a 15 minute feeding and denies pain with latch.  Mom has 11 month experience with older child breastfeeding.  [facility] LC resources given and discussed.  Encouraged to feed with early cues on demand.  Early newborn behavior discussed.  Hand expression reported by mom with colostrum visible.  Mom to call for assist as needed.    Patient Name: Dawn Parker [id number] Date: 10/17/2014 Reason for consult: Initial assessment   Maternal Data Has patient been taught Hand Expression?: Yes Does the patient have breastfeeding experience prior to this delivery?: Yes  Feeding Feeding Type: Breast Fed Length of feed: 15 min  LATCH Score/Interventions Latch: Grasps breast easily, tongue down, lips flanged, rhythmical sucking. Intervention(s): Skin to skin;Teach feeding cues  Audible Swallowing: A few with stimulation Intervention(s): Skin to skin;Hand expression Intervention(s): Alternate breast massage  Type of Nipple: Everted at rest and after stimulation  Comfort (Breast/Nipple): Soft / non-tender     Hold (Positioning): No assistance needed to correctly position infant at breast. Intervention(s): Breastfeeding basics reviewed  LATCH Score: 9  Lactation Tools Discussed/Used     Consult Status Consult Status: Follow-up Date: 10/18/14 Follow-up type: In-patient    Beverely Risen Arvella Merles 10/17/2014, 9:33 PM

## 2014-10-17 NOTE — Progress Notes (Signed)
TC from nurse to evaluate possible hematoma @ 0354  VAVD @ 0117 with 2nd degree laceration repair nurse reports appropriately swollen perineum that was pink in color with inflamed hemorrhoids prior to getting up to bathroom post-delivery  Intense pain on toilet - feeling syncopal from pain - assisted back to bed - unable to sit due to pain Upon re-inspection of perineal area, nurse reports enlarging left labia that is purple in color with marked tenderness and pain to the area.  Initial inspection - left labia 2x size of right - marked edema purple in color ICherly Hensenmed cluster of pink hemorrhoids present Ice pack applied  Dr Cousins notified and updated of assessment - will recheck 30-60 minutes for expansion Analgesia given   Re-assessment @ 0500- pain improved / no expansion in labial edema. Encouraged ice pack continuously next 12-24 hours                  - when ice pack becomes warm to change to new cold one Motrin  Q 8 hours for inflammation and pain Hydrocortisone 2.5 % to hemorrhoids in addition to routine hemorrhoid care   Dawn Parker CNM [facility]

## 2014-10-17 NOTE — Anesthesia Postprocedure Evaluation (Signed)
  Anesthesia Post-op Note  Patient: Dawn Parker  Procedure(s) Performed: * No procedures listed *  Patient Location: Mother/Baby  Anesthesia Type:Epidural  Level of Consciousness: awake and alert   Airway and Oxygen Therapy: Patient Spontanous Breathing  Post-op Pain: mild  Post-op Assessment: Post-op Vital signs reviewed, Patient's Cardiovascular Status Stable, Respiratory Function Stable, No signs of Nausea or vomiting, Pain level controlled, No headache, Spinal receding and Patient able to bend at knees              Post-op Vital Signs: Reviewed  Last Vitals:  Filed Vitals:   10/17/14 1310  BP: 97/63  Pulse: 69  Temp: 36.9 C  Resp: 20    Complications: No apparent anesthesia complications

## 2014-10-17 NOTE — Progress Notes (Signed)
S: Epidural Notes pain in LUQ Increased blood noted per vagina  O: VE 9/100/-1 asynclitic AROM clear fluid IUPC/ISE placed  Tracing: baseline 120 (+) accels Ctx q 2-3 mins  IMP: active phase P) right exaggerated sims position. Cont with pitocin

## 2014-10-17 NOTE — Progress Notes (Signed)
Dr. Wonda Olds called bc pt has a hematoma. Instructed to pack with ice and wait 30 mins.

## 2014-10-18 ENCOUNTER — Encounter (HOSPITAL_COMMUNITY): Payer: Self-pay | Admitting: Obstetrics and Gynecology

## 2014-10-18 DIAGNOSIS — D62 Acute posthemorrhagic anemia: Secondary | ICD-10-CM

## 2014-10-18 HISTORY — DX: Acute posthemorrhagic anemia: D62

## 2014-10-18 LAB — CBC
HCT: 27.8 % — ABNORMAL LOW (ref 36.0–46.0)
Hemoglobin: 9.4 g/dL — ABNORMAL LOW (ref 12.0–15.0)
MCH: 31.8 pg (ref 26.0–34.0)
MCHC: 33.8 g/dL (ref 30.0–36.0)
MCV: 93.9 fL (ref 78.0–100.0)
PLATELETS: 218 10*3/uL (ref 150–400)
RBC: 2.96 MIL/uL — AB (ref 3.87–5.11)
RDW: 13.4 % (ref 11.5–15.5)
WBC: 12.1 10*3/uL — AB (ref 4.0–10.5)

## 2014-10-18 LAB — CCBB MATERNAL DONOR DRAW

## 2014-10-18 MED ORDER — IBUPROFEN 600 MG PO TABS: 600.0000 mg | ORAL_TABLET | Freq: Four times a day (QID) | ORAL | Status: DC

## 2014-10-18 MED ORDER — HYDROCORTISONE 2.5 % RE CREA: TOPICAL_CREAM | Freq: Four times a day (QID) | RECTAL | Status: DC

## 2014-10-18 MED ORDER — FERROUS SULFATE 325 (65 FE) MG PO TABS: 325.0000 mg | ORAL_TABLET | Freq: Two times a day (BID) | ORAL | Status: DC

## 2014-10-18 NOTE — Discharge Instructions (Signed)
Postpartum Depression and Baby Blues The postpartum period begins right after the birth of a baby. During this time, there is often a great amount of joy and excitement. It is also a time of many changes in the life of the parents. Regardless of how many times a mother gives birth, each child brings new challenges and dynamics to the family. It is not unusual to have feelings of excitement along with confusing shifts in moods, emotions, and thoughts. All mothers are at risk of developing postpartum depression or the "baby blues." These mood changes can occur right after giving birth, or they may occur many months after giving birth. The baby blues or postpartum depression can be mild or severe. Additionally, postpartum depression can go away rather quickly, or it can be a long-term condition.  CAUSES Raised hormone levels and the rapid drop in those levels are thought to be a main cause of postpartum depression and the baby blues. A number of hormones change during and after pregnancy. Estrogen and progesterone usually decrease right after the delivery of your baby. The levels of thyroid hormone and various cortisol steroids also rapidly drop. Other factors that play a role in these mood changes include major life events and genetics.  RISK FACTORS If you have any of the following risks for the baby blues or postpartum depression, know what symptoms to watch out for during the postpartum period. Risk factors that may increase the likelihood of getting the baby blues or postpartum depression include: Having a personal or family history of depression.  Having depression while being pregnant.  Having premenstrual mood issues or mood issues related to oral contraceptives. Having a lot of life stress.  Having marital conflict.  Lacking a social support network.  Having a baby with special needs.  Having health problems, such as diabetes.  SIGNS AND SYMPTOMS Symptoms of baby blues include: Brief  changes in mood, such as going from extreme happiness to sadness. Decreased concentration.  Difficulty sleeping.  Crying spells, tearfulness.  Irritability.  Anxiety.  Symptoms of postpartum depression typically begin within the first month after giving birth. These symptoms include: Difficulty sleeping or excessive sleepiness.  Marked weight loss.  Agitation.  Feelings of worthlessness.  Lack of interest in activity or food.  Postpartum psychosis is a very serious condition and can be dangerous. Fortunately, it is rare. Displaying any of the following symptoms is cause for immediate medical attention. Symptoms of postpartum psychosis include:  Hallucinations and delusions.  Bizarre or disorganized behavior.  Confusion or disorientation.  DIAGNOSIS  A diagnosis is made by an evaluation of your symptoms. There are no medical or lab tests that lead to a diagnosis, but there are various questionnaires that a health care provider may use to identify those with the baby blues, postpartum depression, or psychosis. Often, a screening tool called the [place] Postnatal Depression Scale is used to diagnose depression in the postpartum period.  TREATMENT The baby blues usually goes away on its own in 1-2 weeks. Social support is often all that is needed. You will be encouraged to get adequate sleep and rest. Occasionally, you may be given medicines to help you sleep.  Postpartum depression requires treatment because it can last several months or longer if it is not treated. Treatment may include individual or group therapy, medicine, or both to address any social, physiological, and psychological factors that may play a role in the depression. Regular exercise, a healthy diet, rest, and social support may also be strongly  recommended.  Postpartum psychosis is more serious and needs treatment right away. Hospitalization is often needed. HOME CARE INSTRUCTIONS Get as much rest as you can.  Nap when the baby sleeps.  Exercise regularly. Some women find yoga and walking to be beneficial.  Eat a balanced and nourishing diet.  Do little things that you enjoy. Have a cup of tea, take a bubble bath, read your favorite magazine, or listen to your favorite music. Avoid alcohol.  Ask for help with household chores, cooking, grocery shopping, or running errands as needed. Do not try to do everything.  Talk to people close to you about how you are feeling. Get support from your partner, family members, friends, or other new moms. Try to stay positive in how you think. Think about the things you are grateful for.  Do not spend a lot of time alone.  Only take over-the-counter or prescription medicine as directed by your health care provider. Keep all your postpartum appointments.  Let your health care provider know if you have any concerns.  SEEK MEDICAL CARE IF: You are having a reaction to or problems with your medicine. SEEK IMMEDIATE MEDICAL CARE IF: You have suicidal feelings.  You think you may harm the baby or someone else. MAKE SURE YOU: Understand these instructions. Will watch your condition. Will get help right away if you are not doing well or get worse. Document Released: 11/12/2003 Document Revised: 02/12/2013 Document Reviewed: 11/19/2012 [facility] Patient Information 2015 [place], [place]. This information is not intended to replace advice given to you by your health care provider. Make sure you discuss any questions you have with your health care provider. Breast Pumping Tips If you are breastfeeding, there may be times when you cannot feed your baby directly. Returning to work or going on a trip are common examples. Pumping allows you to store breast milk and feed it to your baby later.  You may not get much milk when you first start to pump. Your breasts should start to make more after a few days. If you pump at the times you usually feed your baby, you may be  able to keep making enough milk to feed your baby without also using formula. The more often you pump, the more milk you will produce. WHEN SHOULD I PUMP?   You can begin to pump soon after delivery. However, some experts recommend waiting about 4 weeks before giving your infant a bottle to make sure breastfeeding is going well.  If you plan to return to work, begin pumping a few weeks before. This will help you develop techniques that work best for you. It also lets you build up a supply of breast milk.   When you are with your infant, feed on demand and pump after each feeding.   When you are away from your infant for several hours, pump for about 15 minutes every 2-3 hours. Pump both breasts at the same time if you can.   If your infant has a formula feeding, make sure to pump around the same time.   If you drink any alcohol, wait 2 hours before pumping.  HOW DO I PREPARE TO PUMP? Your let-down reflexis the natural reaction to stimulation that makes your breast milk flow. It is easier to stimulate this reflex when you are relaxed. Find relaxation techniques that work for you. If you have difficulty with your let-down reflex, try these methods:   Smell one of your infant's blankets or an item of clothing.  Look at a picture or video of your infant.   Sit in a quiet, private space.   Massage the breast you plan to pump.   Place soothing warmth on the breast.   Play relaxing music.  WHAT ARE SOME GENERAL BREAST PUMPING TIPS?  Wash your hands before you pump. You do not need to wash your nipples or breasts.  There are three ways to pump.  You can use your hand to massage and compress your breast.  You can use a handheld manual pump.  You can use an electric pump.   Make sure the suction cup (flange) on the breast pump is the right size. Place the flange directly over the nipple. If it is the wrong size or placed the wrong way, it may be painful and cause  nipple damage.   If pumping is uncomfortable, apply a small amount of purified or modified lanolin to your nipple and areola.  If you are using an electric pump, adjust the speed and suction power to be more comfortable.  If pumping is painful or if you are not getting very much milk, you may need a different type of pump. A lactation consultant can help you determine what type of pump to use.   Keep a full water bottle near you at all times. Drinking lots of fluid helps you make more milk.  You can store your milk to use later. Pumped breast milk can be stored in a sealable, sterile container or plastic bag. Label all stored breast milk with the date you pumped it.  Milk can stay out at room temperature for up to 8 hours.  You can store your milk in the refrigerator for up to 8 days.  You can store your milk in the freezer for 3 months. Thaw frozen milk using warm water. Do not put it in the microwave.  Do not smoke. Smoking can lower your milk supply and harm your infant. If you need help quitting, ask your health care provider to recommend a program.  WHEN SHOULD I CALL MY HEALTH CARE PROVIDER OR A LACTATION CONSULTANT?  You are having trouble pumping.  You are concerned that you are not making enough milk.  You have nipple pain, soreness, or redness.  You want to use birth control. Birth control pills may lower your milk supply. Talk to your health care provider about your options. Document Released: 07/28/2009 Document Revised: 02/12/2013 Document Reviewed: 11/30/2012 [facility] Patient Information 2015 [place], [place]. This information is not intended to replace advice given to you by your health care provider. Make sure you discuss any questions you have with your health care provider. Breastfeeding Deciding to breastfeed is one of the best choices you can make for you and your baby. A change in hormones during pregnancy causes your breast tissue to grow and increases the  number and size of your milk ducts. These hormones also allow proteins, sugars, and fats from your blood supply to make breast milk in your milk-producing glands. Hormones prevent breast milk from being released before your baby is born as well as prompt milk flow after birth. Once breastfeeding has begun, thoughts of your baby, as well as his or her sucking or crying, can stimulate the release of milk from your milk-producing glands.  BENEFITS OF BREASTFEEDING For Your Baby  Your first milk (colostrum) helps your baby's digestive system function better.   There are antibodies in your milk that help your baby fight off infections.   Your  baby has a lower incidence of asthma, allergies, and sudden infant death syndrome.   The nutrients in breast milk are better for your baby than infant formulas and are designed uniquely for your baby's needs.   Breast milk improves your baby's brain development.   Your baby is less likely to develop other conditions, such as childhood obesity, asthma, or type 2 diabetes mellitus.  For You   Breastfeeding helps to create a very special bond between you and your baby.   Breastfeeding is convenient. Breast milk is always available at the correct temperature and costs nothing.   Breastfeeding helps to burn calories and helps you lose the weight gained during pregnancy.   Breastfeeding makes your uterus contract to its prepregnancy size faster and slows bleeding (lochia) after you give birth.   Breastfeeding helps to lower your risk of developing type 2 diabetes mellitus, osteoporosis, and breast or ovarian cancer later in life. SIGNS THAT YOUR BABY IS HUNGRY Early Signs of Hunger  Increased alertness or activity.  Stretching.  Movement of the head from side to side.  Movement of the head and opening of the mouth when the corner of the mouth or cheek is stroked (rooting).  Increased sucking sounds, smacking lips, cooing, sighing, or  squeaking.  Hand-to-mouth movements.  Increased sucking of fingers or hands. Late Signs of Hunger  Fussing.  Intermittent crying. Extreme Signs of Hunger Signs of extreme hunger will require calming and consoling before your baby will be able to breastfeed successfully. Do not wait for the following signs of extreme hunger to occur before you initiate breastfeeding:   Restlessness.  A loud, strong cry.   Screaming. BREASTFEEDING BASICS Breastfeeding Initiation  Find a comfortable place to sit or lie down, with your neck and back well supported.  Place a pillow or rolled up blanket under your baby to bring him or her to the level of your breast (if you are seated). Nursing pillows are specially designed to help support your arms and your baby while you breastfeed.  Make sure that your baby's abdomen is facing your abdomen.   Gently massage your breast. With your fingertips, massage from your chest wall toward your nipple in a circular motion. This encourages milk flow. You may need to continue this action during the feeding if your milk flows slowly.  Support your breast with 4 fingers underneath and your thumb above your nipple. Make sure your fingers are well away from your nipple and your baby's mouth.   Stroke your baby's lips gently with your finger or nipple.   When your baby's mouth is open wide enough, quickly bring your baby to your breast, placing your entire nipple and as much of the colored area around your nipple (areola) as possible into your baby's mouth.   More areola should be visible above your baby's upper lip than below the lower lip.   Your baby's tongue should be between his or her lower gum and your breast.   Ensure that your baby's mouth is correctly positioned around your nipple (latched). Your baby's lips should create a seal on your breast and be turned out (everted).  It is common for your baby to suck about 2-3 minutes in order to start the  flow of breast milk. Latching Teaching your baby how to latch on to your breast properly is very important. An improper latch can cause nipple pain and decreased milk supply for you and poor weight gain in your baby. Also, if your  baby is not latched onto your nipple properly, he or she may swallow some air during feeding. This can make your baby fussy. Burping your baby when you switch breasts during the feeding can help to get rid of the air. However, teaching your baby to latch on properly is still the best way to prevent fussiness from swallowing air while breastfeeding. Signs that your baby has successfully latched on to your nipple:    Silent tugging or silent sucking, without causing you pain.   Swallowing heard between every 3-4 sucks.    Muscle movement above and in front of his or her ears while sucking.  Signs that your baby has not successfully latched on to nipple:   Sucking sounds or smacking sounds from your baby while breastfeeding.  Nipple pain. If you think your baby has not latched on correctly, slip your finger into the corner of your baby's mouth to break the suction and place it between your baby's gums. Attempt breastfeeding initiation again. Signs of Successful Breastfeeding Signs from your baby:   A gradual decrease in the number of sucks or complete cessation of sucking.   Falling asleep.   Relaxation of his or her body.   Retention of a small amount of milk in his or her mouth.   Letting go of your breast by himself or herself. Signs from you:  Breasts that have increased in firmness, weight, and size 1-3 hours after feeding.   Breasts that are softer immediately after breastfeeding.  Increased milk volume, as well as a change in milk consistency and color by the fifth day of breastfeeding.   Nipples that are not sore, cracked, or bleeding. Signs That Your Pecola Leisure is Getting Enough Milk  Wetting at least 3 diapers in a 24-hour period. The  urine should be clear and pale yellow by age 750 days.  At least 3 stools in a 24-hour period by age 750 days. The stool should be soft and yellow.  At least 3 stools in a 24-hour period by age 757 days. The stool should be seedy and yellow.  No loss of weight greater than 10% of birth weight during the first 40 days of age.  Average weight gain of 4-7 ounces (113-198 g) per week after age 75 days.  Consistent daily weight gain by age 750 days, without weight loss after the age of 2 weeks. After a feeding, your baby may spit up a small amount. This is common. BREASTFEEDING FREQUENCY AND DURATION Frequent feeding will help you make more milk and can prevent sore nipples and breast engorgement. Breastfeed when you feel the need to reduce the fullness of your breasts or when your baby shows signs of hunger. This is called "breastfeeding on demand." Avoid introducing a pacifier to your baby while you are working to establish breastfeeding (the first 4-6 weeks after your baby is born). After this time you may choose to use a pacifier. Research has shown that pacifier use during the first year of a baby's life decreases the risk of sudden infant death syndrome (SIDS). Allow your baby to feed on each breast as long as he or she wants. Breastfeed until your baby is finished feeding. When your baby unlatches or falls asleep while feeding from the first breast, offer the second breast. Because newborns are often sleepy in the first few weeks of life, you may need to awaken your baby to get him or her to feed. Breastfeeding times will vary from baby to baby. However,  the following rules can serve as a guide to help you ensure that your baby is properly fed:  Newborns (babies 58 weeks of age or younger) may breastfeed every 1-3 hours.  Newborns should not go longer than 3 hours during the day or 5 hours during the night without breastfeeding.  You should breastfeed your baby a minimum of 8 times in a 24-hour period  until you begin to introduce solid foods to your baby at around 96 months of age. BREAST MILK PUMPING Pumping and storing breast milk allows you to ensure that your baby is exclusively fed your breast milk, even at times when you are unable to breastfeed. This is especially important if you are going back to work while you are still breastfeeding or when you are not able to be present during feedings. Your lactation consultant can give you guidelines on how long it is safe to store breast milk.  A breast pump is a machine that allows you to pump milk from your breast into a sterile bottle. The pumped breast milk can then be stored in a refrigerator or freezer. Some breast pumps are operated by hand, while others use electricity. Ask your lactation consultant which type will work best for you. Breast pumps can be purchased, but some hospitals and breastfeeding support groups lease breast pumps on a monthly basis. A lactation consultant can teach you how to hand express breast milk, if you prefer not to use a pump.  CARING FOR YOUR BREASTS WHILE YOU BREASTFEED Nipples can become dry, cracked, and sore while breastfeeding. The following recommendations can help keep your breasts moisturized and healthy:  Avoid using soap on your nipples.   Wear a supportive bra. Although not required, special nursing bras and tank tops are designed to allow access to your breasts for breastfeeding without taking off your entire bra or top. Avoid wearing underwire-style bras or extremely tight bras.  Air dry your nipples for 3-52minutes after each feeding.   Use only cotton bra pads to absorb leaked breast milk. Leaking of breast milk between feedings is normal.   Use lanolin on your nipples after breastfeeding. Lanolin helps to maintain your skin's normal moisture barrier. If you use pure lanolin, you do not need to wash it off before feeding your baby again. Pure lanolin is not toxic to your baby. You may also hand  express a few drops of breast milk and gently massage that milk into your nipples and allow the milk to air dry. In the first few weeks after giving birth, some women experience extremely full breasts (engorgement). Engorgement can make your breasts feel heavy, warm, and tender to the touch. Engorgement peaks within 3-5 days after you give birth. The following recommendations can help ease engorgement:  Completely empty your breasts while breastfeeding or pumping. You may want to start by applying warm, moist heat (in the shower or with warm water-soaked hand towels) just before feeding or pumping. This increases circulation and helps the milk flow. If your baby does not completely empty your breasts while breastfeeding, pump any extra milk after he or she is finished.  Wear a snug bra (nursing or regular) or tank top for 1-2 days to signal your body to slightly decrease milk production.  Apply ice packs to your breasts, unless this is too uncomfortable for you.  Make sure that your baby is latched on and positioned properly while breastfeeding. If engorgement persists after 48 hours of following these recommendations, contact your health care  provider or a Advertising copywriter. OVERALL HEALTH CARE RECOMMENDATIONS WHILE BREASTFEEDING  Eat healthy foods. Alternate between meals and snacks, eating 3 of each per day. Because what you eat affects your breast milk, some of the foods may make your baby more irritable than usual. Avoid eating these foods if you are sure that they are negatively affecting your baby.  Drink milk, fruit juice, and water to satisfy your thirst (about 10 glasses a day).   Rest often, relax, and continue to take your prenatal vitamins to prevent fatigue, stress, and anemia.  Continue breast self-awareness checks.  Avoid chewing and smoking tobacco.  Avoid alcohol and drug use. Some medicines that may be harmful to your baby can pass through breast milk. It is important  to ask your health care provider before taking any medicine, including all over-the-counter and prescription medicine as well as vitamin and herbal supplements. It is possible to become pregnant while breastfeeding. If birth control is desired, ask your health care provider about options that will be safe for your baby. SEEK MEDICAL CARE IF:   You feel like you want to stop breastfeeding or have become frustrated with breastfeeding.  You have painful breasts or nipples.  Your nipples are cracked or bleeding.  Your breasts are red, tender, or warm.  You have a swollen area on either breast.  You have a fever or chills.  You have nausea or vomiting.  You have drainage other than breast milk from your nipples.  Your breasts do not become full before feedings by the fifth day after you give birth.  You feel sad and depressed.  Your baby is too sleepy to eat well.  Your baby is having trouble sleeping.   Your baby is wetting less than 3 diapers in a 24-hour period.  Your baby has less than 3 stools in a 24-hour period.  Your baby's skin or the white part of his or her eyes becomes yellow.   Your baby is not gaining weight by 93 days of age. SEEK IMMEDIATE MEDICAL CARE IF:   Your baby is overly tired (lethargic) and does not want to wake up and feed.  Your baby develops an unexplained fever. Document Released: 02/07/2005 Document Revised: 02/12/2013 Document Reviewed: 08/01/2012 [facility] Patient Information 2015 [place], [place]. This information is not intended to replace advice given to you by your health care provider. Make sure you discuss any questions you have with your health care provider. Postpartum Care After Vaginal Delivery After you deliver your newborn (postpartum period), the usual stay in the hospital is 24-72 hours. If there were problems with your labor or delivery, or if you have other medical problems, you might be in the hospital longer.  While you are in  the hospital, you will receive help and instructions on how to care for yourself and your newborn during the postpartum period.  While you are in the hospital:  Be sure to tell your nurses if you have pain or discomfort, as well as where you feel the pain and what makes the pain worse.  If you had an incision made near your vagina (episiotomy) or if you had some tearing during delivery, the nurses may put ice packs on your episiotomy or tear. The ice packs may help to reduce the pain and swelling.  If you are breastfeeding, you may feel uncomfortable contractions of your uterus for a couple of weeks. This is normal. The contractions help your uterus get back to normal size.  It  is normal to have some bleeding after delivery.  For the first 1-3 days after delivery, the flow is red and the amount may be similar to a period.  It is common for the flow to start and stop.  In the first few days, you may pass some small clots. Let your nurses know if you begin to pass large clots or your flow increases.  Do not  flush blood clots down the toilet before having the nurse look at them.  During the next 3-10 days after delivery, your flow should become more watery and pink or brown-tinged in color.  Ten to fourteen days after delivery, your flow should be a small amount of yellowish-white discharge.  The amount of your flow will decrease over the first few weeks after delivery. Your flow may stop in 6-8 weeks. Most women have had their flow stop by 12 weeks after delivery.  You should change your sanitary pads frequently.  Wash your hands thoroughly with soap and water for at least 20 seconds after changing pads, using the toilet, or before holding or feeding your newborn.  You should feel like you need to empty your bladder within the first 6-8 hours after delivery.  In case you become weak, lightheaded, or faint, call your nurse before you get out of bed for the first time and before you take  a shower for the first time.  Within the first few days after delivery, your breasts may begin to feel tender and full. This is called engorgement. Breast tenderness usually goes away within 48-72 hours after engorgement occurs. You may also notice milk leaking from your breasts. If you are not breastfeeding, do not stimulate your breasts. Breast stimulation can make your breasts produce more milk.  Spending as much time as possible with your newborn is very important. During this time, you and your newborn can feel close and get to know each other. Having your newborn stay in your room (rooming in) will help to strengthen the bond with your newborn. It will give you time to get to know your newborn and become comfortable caring for your newborn.  Your hormones change after delivery. Sometimes the hormone changes can temporarily cause you to feel sad or tearful. These feelings should not last more than a few days. If these feelings last longer than that, you should talk to your caregiver.  If desired, talk to your caregiver about methods of family planning or contraception.  Talk to your caregiver about immunizations. Your caregiver may want you to have the following immunizations before leaving the hospital:  Tetanus, diphtheria, and pertussis (Tdap) or tetanus and diphtheria (Td) immunization. It is very important that you and your family (including grandparents) or others caring for your newborn are up-to-date with the Tdap or Td immunizations. The Tdap or Td immunization can help protect your newborn from getting ill.  Rubella immunization.  Varicella (chickenpox) immunization.  Influenza immunization. You should receive this annual immunization if you did not receive the immunization during your pregnancy. Document Released: 12/05/2006 Document Revised: 11/02/2011 Document Reviewed: 10/05/2011 [facility] Patient Information 2015 [place], [place]. This information is not intended to replace  advice given to you by your health care provider. Make sure you discuss any questions you have with your health care provider. Nutrition for the New Mother  A new mother needs good health and nutrition so she can have energy to take care of a new baby. Whether a mother breastfeeds or formula feeds the baby, it  is important to have a well-balanced diet. Foods from all the food groups should be chosen to meet the new mother's energy needs and to give her the nutrients needed for repair and healing.  A HEALTHY EATING PLAN The My Pyramid plan for Moms outlines what you should eat to help you and your baby stay healthy. The energy and amount of food you need depends on whether or not you are breastfeeding. If you are breastfeeding you will need more nutrients. If you choose not to breastfeed, your nutrition goal should be to return to a healthy weight. Limiting calories may be needed if you are not breastfeeding.  HOME CARE INSTRUCTIONS  For a personal plan based on your unique needs, see your Registered Dietitian or visit [email or web address]. Eat a variety of foods. The plan below will help guide you. The following chart has a suggested daily meal plan from the My Pyramid for Moms. Eat a variety of fruits and vegetables. Eat more dark green and orange vegetables and cooked dried beans. Make half your grains whole grains. Choose whole instead of refined grains. Choose low-fat or lean meats and poultry. Choose low-fat or fat-free dairy products like milk, cheese, or yogurt. Fruits Breastfeeding: 2 cups Non-Breastfeeding: 2 cups What Counts as a serving? 1 cup of fruit or juice.  cup dried fruit. Vegetables Breastfeeding: 3 cups Non-Breastfeeding: 2  cups What Counts as a serving? 1 cup raw or cooked vegetables. Juice or 2 cups raw leafy vegetables. Grains Breastfeeding: 8 oz Non-Breastfeeding: 6 oz What Counts as a serving? 1 slice bread. 1 oz ready-to-eat cereal.  cup cooked pasta, rice,  or cereal. Meat and Beans Breastfeeding: 6  oz Non-Breastfeeding: 5  oz What Counts as a serving? 1 oz lean meat, poultry, or fish  cup cooked dry beans  oz nuts or 1 egg 1 tbs peanut butter Milk Breastfeeding: 3 cups Non-Breastfeeding: 3 cups What Counts as a serving? 1 cup milk. 8 oz yogurt. 1  oz cheese. 2 oz processed cheese. TIPS FOR THE BREASTFEEDING MOM Rapid weight loss is not suggested when you are breastfeeding. By simply breastfeeding, you will be able to lose the weight gained during your pregnancy. Your caregiver can keep track of your weight and tell you if your weight loss is appropriate. Be sure to drink fluids. You may notice that you are thirstier than usual. A suggestion is to drink a glass of water or other beverage whenever you breastfeed. Avoid alcohol as it can be passed into your breast milk. Limit caffeine drinks to no more than 2 to 3 cups per day. You may need to keep taking your prenatal vitamin while you are breastfeeding. Talk with your caregiver about taking a vitamin or supplement. RETURING TO A HEALTHY WEIGHT The My Pyramid Plan for Moms will help you return to a healthy weight. It will also provide the nutrients you need. You may need to limit "empty" calories. These include: High fat foods like fried foods, fatty meats, fast food, butter, and mayonnaise. High sugar foods like sodas, jelly, candy, and sweets. Be physically active. Include 30 minutes of exercise or more each day. Choose an activity you like such as walking, swimming, biking, or aerobics. Check with your caregiver before you start to exercise. Document Released: 05/17/2007 Document Revised: 05/02/2011 Document Reviewed: 05/17/2007 [facility] Patient Information 2015 [place], [place]. This information is not intended to replace advice given to you by your health care provider. Make sure you discuss any questions you have with your  health care provider. ° °

## 2014-10-18 NOTE — Progress Notes (Signed)
Patient ID: Dawn Parker, female   DOB: 17-Jun-1985, 29 y.o.   MRN: [id number] PPD # 1 SVD  S:  Reports feeling well             Tolerating po/ No nausea or vomiting             Bleeding is light             Pain controlled with ibuprofen (OTC)             Up ad lib / ambulatory / voiding without difficulties    Newborn  Information for the patient's newborn:  Kenda, Kloehn [[id number]]  female  breast feeding  / Circumcision planning   O:  A & O x 3, in no apparent distress              VS:  Filed Vitals:   10/17/14 0838 10/17/14 1310 10/17/14 1725 10/18/14 0532  BP: 96/44 97/63 111/64 91/46  Pulse: 52 69 76 65  Temp:  98.5 F (36.9 C) 98.7 F (37.1 C)   TempSrc:  Oral Oral   Resp: Height:      Weight:      SpO2:        LABS:  Recent Labs  10/16/14 1753 10/18/14 0538  WBC 15.1* 12.1*  HGB 11.9* 9.4*  HCT 35.1* 27.8*  PLT 250 218    Blood type: A POS (08/25 1819)  Rubella: Immune (01/19 0000)   I&O: I/O last 3 completed shifts: In: -  Out: 350 [Blood:350]             Lungs: Clear and unlabored  Heart: regular rate and rhythm / no murmurs  Abdomen: soft, non-tender, non-distended              Fundus: firm, non-tender, U-2  Perineum: 2nd degree repair healing well  Lochia: minimal  Extremities: No edema, no calf pain or tenderness, No Homans    A/P: PPD # 1  29 y.o., [id number]   Principal Problem:   Postpartum care following vaginal delivery (8/26) Active Problems:   Oligohydramnios in third trimester, antepartum   Postpartum vulval hematoma - left   Status post vacuum-assisted vaginal delivery   Doing well - stable status  Routine post partum orders  Early D/C home today    Raelyn Mora, M, MSN, CNM 10/18/2014, 8:02 AM

## 2014-10-18 NOTE — Discharge Summary (Signed)
Obstetric Discharge Summary Reason for Admission: induction of labor Prenatal Procedures: NST and ultrasound Intrapartum Course: Admitted for IOL d/t oligohydramnios / epidural for pain management / normal progression to complete dilation  / SVD of viable female with 2nd degree repair by Dr. Cherly Hensen / no immediate postpartum complications noted Intrapartum Procedures: spontaneous vaginal delivery Postpartum Procedures: none Complications-Operative and Postpartum: 2nd degree perineal laceration HEMOGLOBIN  Date Value Ref Range Status  10/18/2014 9.4* 12.0 - 15.0 g/dL Final    Comment:    REPEATED TO VERIFY DELTA CHECK NOTED    HCT  Date Value Ref Range Status  10/18/2014 27.8* 36.0 - 46.0 % Final    Physical Exam:  General: alert, cooperative and no distress Lochia: appropriate Uterine Fundus: firm, midline, U-2 Perineum: healing well, no significant drainage, no dehiscence, no significant erythema DVT Evaluation: No evidence of DVT seen on physical exam. Negative Homan's sign. No cords or calf tenderness. No significant calf/ankle edema.  Discharge Diagnoses: Term Pregnancy-delivered  Discharge Information: Date: 10/18/2014 Activity: pelvic rest Diet: routine Medications: PNV, Ibuprofen and Iron Condition: stable Instructions: refer to practice specific booklet Discharge to: home Follow-up Information    Follow up with COUSINS,SHERONETTE A, MD. Schedule an appointment as soon as possible for a visit in 6 weeks.   Specialty:  Obstetrics and Gynecology   Why:  postpartum visit   Contact information:   [address] [place] [address] [phone]       Newborn Data: Live born female on 10/17/2014 Birth Weight: 8 lb 8.5 oz (3870 g) APGAR: 9, 10  Home with mother.  Raelyn Mora, M MSN, CNM 10/18/2014, 11:37 AM
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
## 2017-08-02 ENCOUNTER — Ambulatory Visit (INDEPENDENT_AMBULATORY_CARE_PROVIDER_SITE_OTHER): Payer: BLUE CROSS/BLUE SHIELD | Admitting: Family Medicine

## 2017-08-02 ENCOUNTER — Ambulatory Visit: Payer: Self-pay

## 2017-08-02 VITALS — BP 106/68 | HR 73 | Ht 63.0 in

## 2017-08-02 DIAGNOSIS — M7050 Other bursitis of knee, unspecified knee: Secondary | ICD-10-CM | POA: Diagnosis not present

## 2017-08-02 DIAGNOSIS — M25562 Pain in left knee: Secondary | ICD-10-CM

## 2017-08-02 DIAGNOSIS — G8929 Other chronic pain: Secondary | ICD-10-CM

## 2017-08-02 MED ORDER — NITROGLYCERIN 0.2 MG/HR TD PT24: MEDICATED_PATCH | TRANSDERMAL | 1 refills | Status: DC

## 2017-08-02 MED ORDER — VITAMIN D (ERGOCALCIFEROL) 1.25 MG (50000 UNIT) PO CAPS: 50000.0000 [IU] | ORAL_CAPSULE | ORAL | 0 refills | Status: AC

## 2017-08-02 MED ORDER — VITAMIN D (ERGOCALCIFEROL) 1.25 MG (50000 UNIT) PO CAPS: 50000.0000 [IU] | ORAL_CAPSULE | ORAL | 0 refills | Status: DC

## 2017-08-02 MED ORDER — DICLOFENAC SODIUM 2 % TD SOLN: 2.0000 g | Freq: Two times a day (BID) | TRANSDERMAL | 3 refills | Status: DC

## 2017-08-02 MED ORDER — NITROGLYCERIN 0.2 MG/HR TD PT24: MEDICATED_PATCH | TRANSDERMAL | 1 refills | Status: AC
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
## 2017-08-02 NOTE — Patient Instructions (Signed)
Good to see you.  Ice 20 minutes 2 times daily. Usually after activity and before bed. Exercises 3 times a week.  pennsaid pinkie amount topically 2 times daily as needed.  Once weekly vitamin D for 12 weeks Nitroglycerin Protocol   Apply 1/4 nitroglycerin patch to affected area daily.  Change position of patch within the affected area every 24 hours.  You may experience a headache during the first 1-2 weeks of using the patch, these should subside.  If you experience headaches after beginning nitroglycerin patch treatment, you may take your preferred over the counter pain reliever.  Another side effect of the nitroglycerin patch is skin irritation or rash related to patch adhesive.  Please notify our office if you develop more severe headaches or rash, and stop the patch.  Tendon healing with nitroglycerin patch may require 12 to 24 weeks depending on the extent of injury.  Men should not use if taking Viagra, Cialis, or Levitra.   Do not use if you have migraines or rosacea. Thigh compression sleeve with running and with working out.  Shorten stride length with running  Tart cherry extract any dose at night  See me again In 4 weeks

## 2017-08-02 NOTE — Assessment & Plan Note (Signed)
Presents wearing.  Seems to be on the left side.  Discussed with patient about icing regimen and home exercise.  Discussed thigh compression, I do think that there is some bony abnormality in the area as well likely secondary to the injury initially.  Do not see any signs that would be consistent with a meniscal injury today.  Patient will take medications as prescribed follow-up with me again in 4 weeks

## 2017-09-04 NOTE — Progress Notes (Signed)
[name] [address] [place], [place] [address] Phone: [phone] Subjective:    I'm seeing this patient by the request  of:    CC: Left knee pain follow-up  BJY:NWGNFAOZHY  Dawn Parker is a 32 y.o. female coming in with complaint of left knee pain. She has had some sharp pain over patellar tendon since last visit. She has been standing at work more than usual. Has been able to continue physical activity. She has tried interval running and has been able to run more each day. Patient has been using nitro patches which she says helps prevent stiffness in knee in the morning.  Found to have a more proposed anserine bursitis.  States that she can now run 1/2 mile without any significant discomfort.      Past Medical History:  Diagnosis Date  . Acute blood loss anemia 10/18/2014  . Medical history non-contributory   . Postpartum care following vaginal delivery (1/25) 03/17/2013   Past Surgical History:  Procedure Laterality Date  . NO PAST SURGERIES     Social History   Socioeconomic History  . Marital status: Married    Spouse name: Not on file  . Number of children: Not on file  . Years of education: Not on file  . Highest education level: Not on file  Occupational History  . Occupation: Teacher, early years/pre  Social Needs  . Financial resource strain: Not on file  . Food insecurity:    Worry: Not on file    Inability: Not on file  . Transportation needs:    Medical: Not on file    Non-medical: Not on file  Tobacco Use  . Smoking status: Never Smoker  Substance and Sexual Activity  . Alcohol use: No  . Drug use: No  . Sexual activity: Yes    Comment: pregnant  Lifestyle  . Physical activity:    Days per week: Not on file    Minutes per session: Not on file  . Stress: Not on file  Relationships  . Social connections:    Talks on phone: Not on file    Gets together: Not on file    Attends religious service: Not on file    Active member  of club or organization: Not on file    Attends meetings of clubs or organizations: Not on file    Relationship status: Not on file  Other Topics Concern  . Not on file  Social History Narrative   ** Merged History Encounter **       No Known Allergies Family History  Problem Relation Age of Onset  . Hyperlipidemia Mother   . Hyperlipidemia Father   . Heart disease Maternal Grandmother   . Cancer Paternal Grandmother        lung, throat and mouth     Past medical history, social, surgical and family history all reviewed in electronic medical record.  No pertanent information unless stated regarding to the chief complaint.   Review of Systems:Review of systems updated and as accurate as of 09/05/17  No headache, visual changes, nausea, vomiting, diarrhea, constipation, dizziness, abdominal pain, skin rash, fevers, chills, night sweats, weight loss, swollen lymph nodes, body aches, joint swelling, muscle aches, chest pain, shortness of breath, mood changes.   Objective  Blood pressure 102/66, pulse 65, height 5\' 3"  (1.6 m), weight 146 lb (66.2 kg), SpO2 98 %, unknown if currently breastfeeding. Systems examined below as of 09/05/17   General: No apparent  distress alert and oriented x3 mood and affect normal, dressed appropriately.  HEENT: Pupils equal, extraocular movements intact  Respiratory: Patient's speak in full sentences and does not appear short of breath  Cardiovascular: No lower extremity edema, non tender, no erythema  Skin: Warm dry intact with no signs of infection or rash on extremities or on axial skeleton.  Abdomen: Soft nontender  Neuro: Cranial nerves II through XII are intact, neurovascularly intact in all extremities with 2+ DTRs and 2+ pulses.  Lymph: No lymphadenopathy of posterior or anterior cervical chain or axillae bilaterally.  Gait normal with good balance and coordination.  MSK:  Non tender with full range of motion and good stability and symmetric  strength and tone of shoulders, elbows, wrist, hip, and ankles bilaterally.  Knee: Left Normal to inspection with no erythema or effusion or obvious bony abnormalities. Palpation normal with no warmth, joint line tenderness, patellar tenderness, or condyle tenderness. ROM full in flexion and extension and lower leg rotation. Ligaments with solid consistent endpoints including ACL, PCL, LCL, MCL. Negative Mcmurray's, Apley's, and Thessalonian tests. Non painful patellar compression. Patellar glide without crepitus. Patellar and quadriceps tendons unremarkable. Hamstring and quadriceps strength is normal. Contralateral knee unremarkable   Impression and Recommendations:     This case required medical decision making of moderate complexity.      Note: This dictation was prepared with Dragon dictation along with smaller phrase technology. Any transcriptional errors that result from this process are unintentional.

## 2017-09-05 ENCOUNTER — Ambulatory Visit (INDEPENDENT_AMBULATORY_CARE_PROVIDER_SITE_OTHER): Payer: BLUE CROSS/BLUE SHIELD | Admitting: Family Medicine

## 2017-09-05 DIAGNOSIS — M7050 Other bursitis of knee, unspecified knee: Secondary | ICD-10-CM

## 2017-09-05 NOTE — Assessment & Plan Note (Signed)
Patient is making significant improvement.  Continue the nitroglycerin, continue the home exercise, follow-up again in 6 weeks

## 2017-09-05 NOTE — Patient Instructions (Signed)
Good to see you  I am impressed  Continue the nitro and the exercises Add icing after running See me again in 6 weeks and we will hopefully get you off nitro

## 2017-10-16 NOTE — Progress Notes (Signed)
[name] [address] [place], [place] [address] Phone: [phone] Subjective:   Bruce Donath, am serving as a scribe for Dr. Antoine Primas.   CC: Knee pain  KGM:[id number]  Dawn Parker is a 32 y.o. female coming in with complaint of left knee pain. She continues to use nitro patches. She has been able to run longer distances. She does have pain with prolonged standing.  Patient would state overall she is 70 to 80% better.  Has been increasing activity.  Seems to be only with standing at this moment.  Past Medical History:  Diagnosis Date  . Acute blood loss anemia 10/18/2014  . Medical history non-contributory   . Postpartum care following vaginal delivery (1/25) 03/17/2013   Past Surgical History:  Procedure Laterality Date  . NO PAST SURGERIES     Social History   Socioeconomic History  . Marital status: Married    Spouse name: Not on file  . Number of children: Not on file  . Years of education: Not on file  . Highest education level: Not on file  Occupational History  . Occupation: Teacher, early years/pre  Social Needs  . Financial resource strain: Not on file  . Food insecurity:    Worry: Not on file    Inability: Not on file  . Transportation needs:    Medical: Not on file    Non-medical: Not on file  Tobacco Use  . Smoking status: Never Smoker  Substance and Sexual Activity  . Alcohol use: No  . Drug use: No  . Sexual activity: Yes    Comment: pregnant  Lifestyle  . Physical activity:    Days per week: Not on file    Minutes per session: Not on file  . Stress: Not on file  Relationships  . Social connections:    Talks on phone: Not on file    Gets together: Not on file    Attends religious service: Not on file    Active member of club or organization: Not on file    Attends meetings of clubs or organizations: Not on file    Relationship status: Not on file  Other Topics Concern  . Not on file  Social History Narrative   ** Merged History Encounter **       No Known Allergies Family History  Problem Relation Age of Onset  . Hyperlipidemia Mother   . Hyperlipidemia Father   . Heart disease Maternal Grandmother   . Cancer Paternal Grandmother        lung, throat and mouth     Past medical history, social, surgical and family history all reviewed in electronic medical record.  No pertanent information unless stated regarding to the chief complaint.   Review of Systems:Review of systems updated and as accurate as of   No headache, visual changes, nausea, vomiting, diarrhea, constipation, dizziness, abdominal pain, skin rash, fevers, chills, night sweats, weight loss, swollen lymph nodes, body aches, joint swelling,  chest pain, shortness of breath, mood changes.  Positive muscle aches  Objective  Blood pressure 106/60, pulse 60, height 5\' 3"  (1.6 m), weight 144 lb (65.3 kg), SpO2 98 %, unknown if currently breastfeeding. Systems examined below as of    General: No apparent distress alert and oriented x3 mood and affect normal, dressed appropriately.  HEENT: Pupils equal, extraocular movements intact  Respiratory: Patient's speak in full sentences and does not appear short of breath  Cardiovascular: No lower extremity edema,  non tender, no erythema  Skin: Warm dry intact with no signs of infection or rash on extremities or on axial skeleton.  Abdomen: Soft nontender  Neuro: Cranial nerves II through XII are intact, neurovascularly intact in all extremities with 2+ DTRs and 2+ pulses.  Lymph: No lymphadenopathy of posterior or anterior cervical chain or axillae bilaterally.  Gait normal with good balance and coordination.  MSK:  Non tender with full range of motion and good stability and symmetric strength and tone of shoulders, elbows, wrist, hip, and ankles bilaterally.  Knee: Left Normal to inspection with no erythema or effusion or obvious bony abnormalities. Palpation normal with no warmth, joint  line tenderness, patellar tenderness, or condyle tenderness. ROM full in flexion and extension and lower leg rotation. Ligaments with solid consistent endpoints including ACL, PCL, LCL, MCL. Negative Mcmurray's, Apley's, and Thessalonian tests. With mild painful patellar compression. Patellar glide with mild to moderate crepitus. Patellar and quadriceps tendons unremarkable. Hamstring and quadriceps strength is normal. Contralateral knee unremarkable     Impression and Recommendations:     This case required medical decision making of moderate complexity.  The above documentation has been reviewed and is accurate and complete @Brittnei Jagiello  M Lanett Lasorsa@      Note: This dictation was prepared with Dragon dictation along with smaller phrase technology. Any transcriptional errors that result from this process are unintentional.

## 2017-10-17 ENCOUNTER — Ambulatory Visit (INDEPENDENT_AMBULATORY_CARE_PROVIDER_SITE_OTHER): Payer: BLUE CROSS/BLUE SHIELD | Admitting: Family Medicine

## 2017-10-17 ENCOUNTER — Encounter: Payer: Self-pay | Admitting: Family Medicine

## 2017-10-17 DIAGNOSIS — M7050 Other bursitis of knee, unspecified knee: Secondary | ICD-10-CM

## 2017-10-17 NOTE — Patient Instructions (Addendum)
Good to see you  Dawn Parker is your friend.  Stay active Lets not change anything up  Maybe a week without jumping See me again in 2 months and then we will get you off the nitro and back to a regular person!

## 2017-10-17 NOTE — Assessment & Plan Note (Signed)
Minimal findings at this time.  Patient will continue with the nitroglycerin, vitamin D and, topical anti-inflammatories and home exercises and follow-up 1 more time in 6 weeks

## 2017-12-19 ENCOUNTER — Ambulatory Visit: Payer: BLUE CROSS/BLUE SHIELD | Admitting: Family Medicine

## 2018-01-05 ENCOUNTER — Other Ambulatory Visit: Payer: Self-pay | Admitting: Family Medicine

## 2018-01-08 ENCOUNTER — Other Ambulatory Visit: Payer: Self-pay

## 2018-01-08 MED ORDER — DICLOFENAC SODIUM 2 % TD SOLN: 2.0000 g | Freq: Two times a day (BID) | TRANSDERMAL | 3 refills | Status: AC
